# Patient Record
Sex: Female | Born: 1990 | Race: Black or African American | Hispanic: No | Smoking: Never smoker
Health system: Southern US, Community
[De-identification: ages and names within clinical notes are randomized; demographics above are authoritative.]

## PROBLEM LIST (undated history)

## (undated) ENCOUNTER — Inpatient Hospital Stay (HOSPITAL_COMMUNITY): Payer: Self-pay

## (undated) DIAGNOSIS — E282 Polycystic ovarian syndrome: Secondary | ICD-10-CM

## (undated) DIAGNOSIS — Z3490 Encounter for supervision of normal pregnancy, unspecified, unspecified trimester: Secondary | ICD-10-CM

## (undated) HISTORY — PX: NO PAST SURGERIES: SHX2092

---

## 2011-10-30 ENCOUNTER — Ambulatory Visit (INDEPENDENT_AMBULATORY_CARE_PROVIDER_SITE_OTHER): Payer: 59 | Admitting: Internal Medicine

## 2011-10-30 VITALS — BP 100/67 | HR 62 | Temp 98.0°F | Resp 16 | Ht 67.5 in | Wt 165.0 lb

## 2011-10-30 DIAGNOSIS — M542 Cervicalgia: Secondary | ICD-10-CM

## 2011-10-30 DIAGNOSIS — R591 Generalized enlarged lymph nodes: Secondary | ICD-10-CM

## 2011-10-30 DIAGNOSIS — R599 Enlarged lymph nodes, unspecified: Secondary | ICD-10-CM

## 2011-10-30 DIAGNOSIS — J029 Acute pharyngitis, unspecified: Secondary | ICD-10-CM

## 2011-10-30 MED ORDER — AMOXICILLIN 500 MG PO CAPS: 1000.0000 mg | ORAL_CAPSULE | Freq: Three times a day (TID) | ORAL | Status: DC

## 2011-10-30 NOTE — Progress Notes (Signed)
  Subjective:    Patient ID: Allison Barnett, female    DOB: 1990-09-08, 21 y.o.   MRN: [id number]  HPI 21 yo AA F presents to office w/ 2 day hx of swollen glands and throat pain on the left side of her face and neck. Pt. states she feels a pressure like pain under her left ear, under the left side of her jaw and at her neck.  This is worse when she eats certain foods that have carbohydrates in them or are sour flavored.  She specifically mentions chicken alfredo.  Additionally, she feels an itching discomfort along the left side of her throat when she swallows food.  She denies fevers and sinus congestion but says she has exposure to sick contacts at [employer], where she works.  In the past she had something similar and was told she had a blocked salivary gland which responded to ABX tx.    Review of Systems Noncontributory Positives:  -HEENT: Headaches (occasional, treated with ibprofen)      Objective:   Physical Exam General:  Pleasant 21 yo F oriented and cooperative.   Vitals:  Filed Vitals:   10/30/11 1042  BP: 100/67  Pulse: 62  Temp: 98 F (36.7 C)  Resp: 16  HEENT:  -H: nontraumatic,  -E: EOMIT, sclera clear,  -E: L ear tender to palpation and otoschopic exam, L ear canal small/tender redness at the lower meatus,TM's without abnormalities bilaterally, canals clear -N: Nasal turbinates moist and pink,  T: pharynx non-erythematous and without lesions, L neck/,sublingual and submandibular glands tender to palpation but without heat or erythema.Not enlarged.  LN palpable and tender under the angle of the L mandible, trachea midline. Upper molar on the left tender at the root in buccal area Heart: RRR, no murmurs Lungs: CTA bilaterally, no wheezing, rales, rhonci MSK: normal bulk and tone Neuro: alert, oriented, CN grossly in tact      Assessment & Plan:  Assessment: 1) L auricle abscess ? 2) Lymphadenopathy due to ? #1 vs molar abscess vs Sialadenitis 3) ?early viral  pharyngitis    Plan: 1-3) Amoxicillin 500 mg 2 tablets bid x10 days 2-3) Eat sour drops  1-3) RTC if not cleared up in 2-3 days or as needed. ibuprofen

## 2014-09-13 ENCOUNTER — Emergency Department (HOSPITAL_COMMUNITY)
Admission: EM | Admit: 2014-09-13 | Discharge: 2014-09-13 | Disposition: A | Discharge status: Home / Self Care | Payer: BLUE CROSS/BLUE SHIELD | Attending: Emergency Medicine | Admitting: Emergency Medicine

## 2014-09-13 ENCOUNTER — Encounter (HOSPITAL_COMMUNITY): Payer: Self-pay

## 2014-09-13 ENCOUNTER — Emergency Department (HOSPITAL_COMMUNITY): Payer: BLUE CROSS/BLUE SHIELD

## 2014-09-13 DIAGNOSIS — Y998 Other external cause status: Secondary | ICD-10-CM | POA: Diagnosis not present

## 2014-09-13 DIAGNOSIS — S4992XA Unspecified injury of left shoulder and upper arm, initial encounter: Secondary | ICD-10-CM | POA: Diagnosis present

## 2014-09-13 DIAGNOSIS — S43102A Unspecified dislocation of left acromioclavicular joint, initial encounter: Secondary | ICD-10-CM | POA: Insufficient documentation

## 2014-09-13 DIAGNOSIS — Y9389 Activity, other specified: Secondary | ICD-10-CM | POA: Insufficient documentation

## 2014-09-13 DIAGNOSIS — Y92410 Unspecified street and highway as the place of occurrence of the external cause: Secondary | ICD-10-CM | POA: Diagnosis not present

## 2014-09-13 MED ORDER — MORPHINE SULFATE (PF) 2 MG/ML IV SOLN
2.0000 mg | Freq: Once | INTRAVENOUS | Status: AC
  Administered 2014-09-13: 2 mg via INTRAVENOUS
  Filled 2014-09-13: qty 1

## 2014-09-13 MED ORDER — MORPHINE SULFATE 2 MG/ML IJ SOLN
INTRAMUSCULAR | Status: DC | PRN
  Administered 2014-09-13: 4 mg via INTRAVENOUS

## 2014-09-13 MED ORDER — MORPHINE SULFATE (PF) 2 MG/ML IV SOLN
INTRAVENOUS | Status: AC
  Filled 2014-09-13: qty 2

## 2014-09-13 MED ORDER — OXYCODONE-ACETAMINOPHEN 5-325 MG PO TABS: 1.0000 | ORAL_TABLET | Freq: Four times a day (QID) | ORAL | Status: DC | PRN

## 2014-09-13 MED ORDER — OXYCODONE-ACETAMINOPHEN 5-325 MG PO TABS
1.0000 | ORAL_TABLET | Freq: Once | ORAL | Status: AC
  Administered 2014-09-13: 1 via ORAL
  Filled 2014-09-13: qty 1

## 2014-09-13 MED ORDER — SODIUM CHLORIDE 0.9 % IV SOLN
INTRAVENOUS | Status: DC | PRN
  Administered 2014-09-13: 10 mL/h via INTRAVENOUS

## 2014-09-13 NOTE — ED Notes (Signed)
Pt stable, ambulatory, states understanding of discharge instructions 
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
## 2014-09-13 NOTE — ED Notes (Signed)
X-ray at bedside

## 2014-09-13 NOTE — Discharge Instructions (Signed)
Acromioclavicular Injuries °The acromioclavicular (AC) joint is the joint in the shoulder. There are many bands of tissue (ligaments) that surround the AC bones and joints. These bands of tissue can tear, which can lead to sprains and separations. The bones of the AC joint can also break (fracture).  °HOME CARE  °· Put ice on the injured area. °¨ Put ice in a plastic bag. °¨ Place a towel between your skin and the bag. °¨ Leave the ice on for 15-20 minutes, 03-04 times a day. °· Wear your sling as told by your doctor. Remove the sling before showering and bathing. Keep the shoulder in the same place as when the sling is on. Do not lift the arm. °· Gently tighten your figure-eight splint (if applied) every day. Tighten it enough to keep the shoulders held back. There should be room to place your finger between your body and the strap. Loosen the splint right away if you lose feeling (numbness) or have tingling in your hands. °· Only take medicine as told by your doctor. °· Keep all follow-up visits with your doctor. °GET HELP RIGHT AWAY IF:  °· Your medicine does not help your pain. °· You have more puffiness (swelling) or your bruising gets worse rather than better. °· You were unable to follow up as told by your doctor. °· You have tingling or lose even more feeling in your arm, forearm, or hand. °· Your arm is cold or pale. °· You have more pain in the hand, forearm, or fingers. °MAKE SURE YOU:  °· Understand these instructions. °· Will watch your condition. °· Will get help right away if you are not doing well or get worse. °Document Released: 07/01/2009 Document Revised: 04/05/2011 Document Reviewed: 07/01/2009 °Exit[employer]® Patient Information ©2015 [employer]. This information is not intended to replace advice given to you by your health care provider. Make sure you discuss any questions you have with your health care provider. ° °

## 2014-09-13 NOTE — ED Notes (Signed)
See trauma narrator 

## 2016-01-08 ENCOUNTER — Emergency Department (HOSPITAL_COMMUNITY)
Admission: EM | Admit: 2016-01-08 | Discharge: 2016-01-08 | Disposition: A | Discharge status: Home / Self Care | Payer: Medicaid Other | Attending: Emergency Medicine | Admitting: Emergency Medicine

## 2016-01-08 ENCOUNTER — Encounter (HOSPITAL_COMMUNITY): Payer: Self-pay | Admitting: Emergency Medicine

## 2016-01-08 DIAGNOSIS — N390 Urinary tract infection, site not specified: Secondary | ICD-10-CM

## 2016-01-08 DIAGNOSIS — O2341 Unspecified infection of urinary tract in pregnancy, first trimester: Secondary | ICD-10-CM | POA: Insufficient documentation

## 2016-01-08 DIAGNOSIS — O99281 Endocrine, nutritional and metabolic diseases complicating pregnancy, first trimester: Secondary | ICD-10-CM | POA: Diagnosis not present

## 2016-01-08 DIAGNOSIS — O219 Vomiting of pregnancy, unspecified: Secondary | ICD-10-CM | POA: Diagnosis present

## 2016-01-08 DIAGNOSIS — Z3A01 Less than 8 weeks gestation of pregnancy: Secondary | ICD-10-CM | POA: Diagnosis not present

## 2016-01-08 DIAGNOSIS — O2311 Infections of bladder in pregnancy, first trimester: Secondary | ICD-10-CM | POA: Insufficient documentation

## 2016-01-08 DIAGNOSIS — E860 Dehydration: Secondary | ICD-10-CM

## 2016-01-08 LAB — I-STAT BETA HCG BLOOD, ED (MC, WL, AP ONLY)

## 2016-01-08 LAB — CBC WITH DIFFERENTIAL/PLATELET
BASOS ABS: 0 10*3/uL (ref 0.0–0.1)
Basophils Relative: 0 %
EOS PCT: 0 %
Eosinophils Absolute: 0 10*3/uL (ref 0.0–0.7)
HCT: 37.7 % (ref 36.0–46.0)
HEMOGLOBIN: 13.3 g/dL (ref 12.0–15.0)
LYMPHS PCT: 25 %
Lymphs Abs: 0.8 10*3/uL (ref 0.7–4.0)
MCH: 31.5 pg (ref 26.0–34.0)
MCHC: 35.3 g/dL (ref 30.0–36.0)
MCV: 89.3 fL (ref 78.0–100.0)
Monocytes Absolute: 0.2 10*3/uL (ref 0.1–1.0)
Monocytes Relative: 6 %
NEUTROS PCT: 69 %
Neutro Abs: 2.2 10*3/uL (ref 1.7–7.7)
PLATELETS: 214 10*3/uL (ref 150–400)
RBC: 4.22 MIL/uL (ref 3.87–5.11)
RDW: 11.9 % (ref 11.5–15.5)
WBC: 3.2 10*3/uL — AB (ref 4.0–10.5)

## 2016-01-08 LAB — COMPREHENSIVE METABOLIC PANEL
ALK PHOS: 44 U/L (ref 38–126)
ALT: 16 U/L (ref 14–54)
ANION GAP: 11 (ref 5–15)
AST: 19 U/L (ref 15–41)
Albumin: 4.4 g/dL (ref 3.5–5.0)
BILIRUBIN TOTAL: 1.1 mg/dL (ref 0.3–1.2)
BUN: 7 mg/dL (ref 6–20)
CALCIUM: 9.6 mg/dL (ref 8.9–10.3)
CO2: 21 mmol/L — ABNORMAL LOW (ref 22–32)
Chloride: 104 mmol/L (ref 101–111)
Creatinine, Ser: 0.77 mg/dL (ref 0.44–1.00)
GFR calc non Af Amer: >60 mL/min (ref >60)
Glucose, Bld: 102 mg/dL — ABNORMAL HIGH (ref 65–99)
Potassium: 3.4 mmol/L — ABNORMAL LOW (ref 3.5–5.1)
SODIUM: 136 mmol/L (ref 135–145)
TOTAL PROTEIN: 8.2 g/dL — AB (ref 6.5–8.1)

## 2016-01-08 LAB — URINALYSIS, ROUTINE W REFLEX MICROSCOPIC
Bilirubin Urine: NEGATIVE
GLUCOSE, UA: NEGATIVE mg/dL
HGB URINE DIPSTICK: NEGATIVE
Ketones, ur: 80 mg/dL — AB
NITRITE: POSITIVE — AB
PH: 5 (ref 5.0–8.0)
Protein, ur: 100 mg/dL — AB
SPECIFIC GRAVITY, URINE: 1.029 (ref 1.005–1.030)

## 2016-01-08 LAB — LIPASE, BLOOD: LIPASE: 21 U/L (ref 11–51)

## 2016-01-08 MED ORDER — PYRIDOXINE HCL 100 MG PO TABS: 100.0000 mg | ORAL_TABLET | Freq: Every day | ORAL | 0 refills | Status: DC

## 2016-01-08 MED ORDER — SODIUM CHLORIDE 0.9 % IV BOLUS (SEPSIS)
1000.0000 mL | Freq: Once | INTRAVENOUS | Status: AC
  Administered 2016-01-08: 1000 mL via INTRAVENOUS

## 2016-01-08 MED ORDER — DOXYLAMINE SUCCINATE (SLEEP) 25 MG PO TABS: 25.0000 mg | ORAL_TABLET | Freq: Every evening | ORAL | 0 refills | Status: DC | PRN

## 2016-01-08 MED ORDER — CEPHALEXIN 500 MG PO CAPS: 500.0000 mg | ORAL_CAPSULE | Freq: Three times a day (TID) | ORAL | 0 refills | Status: DC

## 2016-01-08 MED ORDER — PYRIDOXINE HCL 100 MG/ML IJ SOLN
100.0000 mg | Freq: Every day | INTRAMUSCULAR | Status: DC
  Administered 2016-01-08: 100 mg via INTRAVENOUS
  Filled 2016-01-08: qty 1

## 2016-01-08 MED ORDER — DEXTROSE 5 % IV SOLN
1.0000 g | Freq: Once | INTRAVENOUS | Status: AC
  Administered 2016-01-08: 1 g via INTRAVENOUS
  Filled 2016-01-08: qty 10

## 2016-01-08 MED ORDER — PROMETHAZINE HCL 25 MG/ML IJ SOLN
12.5000 mg | Freq: Once | INTRAMUSCULAR | Status: AC
  Administered 2016-01-08: 12.5 mg via INTRAVENOUS
  Filled 2016-01-08: qty 1

## 2016-01-08 NOTE — ED Provider Notes (Signed)
[facility]-EMERGENCY DEPT Provider Note   CSN: [id number] Arrival date & time: 01/08/16  1117     History   Chief Complaint Chief Complaint  Patient presents with  . Emesis    HPI Allison Barnett is a 25 y.o. female.  HPI   25 year old female, G2 A1 who recently found out yesterday that she is pregnant is presenting today complaining of nausea and vomiting. Patient states she believes her last menstrual period was sometime in October. She has a positive pregnancy test 5 weeks ago. She has been doing fine up until yesterday when she developed nausea and vomiting. States that she has been vomiting once hourly of clear fluid but this morning she noticed small trace of blood in her vomitus. Complaining of only mild abdominal discomfort while vomiting but no significant abdominal pain. She noticed that her urine is darker in color with a stronger smell but denies any burning on urination or urinary frequency or urgency. No associated fever, lightheadedness, dizziness, chest pain, shortness of breath, back pain, hematuria, vaginal bleeding, or vaginal discharge. She does report some common pregnancy symptoms such as breasts sensitivity. She was seen by an OB/GYN yesterday and states that she did had a formal ultrasound confirming IUP.  History reviewed. No pertinent past medical history.  There are no active problems to display for this patient.   History reviewed. No pertinent surgical history.  OB History    Gravida Para Term Preterm AB Living   1 0 0 0 0     SAB TAB Ectopic Multiple Live Births   0 0 0           Home Medications    Prior to Admission medications   Medication Sig Start Date End Date Taking? Authorizing Provider  amoxicillin (AMOXIL) 500 MG capsule Take 2 capsules (1,000 mg total) by mouth 3 (three) times daily. 10/30/11   Tonye Pearsonobert P Doolittle, MD  ibuprofen (ADVIL,MOTRIN) 200 MG tablet Take 200 mg by mouth every 6 (six) hours as needed for headache.    Historical  Provider, MD  oxyCODONE-acetaminophen (PERCOCET/ROXICET) 5-325 MG per tablet Take 1 tablet by mouth every 6 (six) hours as needed for severe pain. 09/13/14   Ames DuraStephen Balleh, MD    Family History History reviewed. No pertinent family history.  Social History Social History  Substance Use Topics  . Smoking status: Never Smoker  . Smokeless tobacco: Not on file  . Alcohol use No     Allergies   Patient has no known allergies.   Review of Systems Review of Systems  All other systems reviewed and are negative.    Physical Exam Updated Vital Signs BP 115/81   Pulse 88   Temp 98.5 F (36.9 C) (Oral)   Resp 16   LMP 11/09/2015 (Approximate)   SpO2 100%   Physical Exam  Constitutional: She appears well-developed and well-nourished. No distress.  HENT:  Head: Atraumatic.  Mouth/Throat: Oropharynx is clear and moist.  Eyes: Conjunctivae are normal.  Neck: Neck supple.  Cardiovascular: Normal rate and regular rhythm.   Pulmonary/Chest: Effort normal and breath sounds normal.  Abdominal: Soft. She exhibits no distension. There is tenderness (Mild epigastric tenderness on palpation without guarding or rebound tenderness. Negative Murphy sign, no pain at McBurney's point.).  Genitourinary:  Genitourinary Comments: No CVA tenderness  Neurological: She is alert.  Skin: No rash noted.  Psychiatric: She has a normal mood and affect.  Nursing note and vitals reviewed.    ED Treatments / Results  Labs (all labs ordered are listed, but only abnormal results are displayed) Labs Reviewed  CBC WITH DIFFERENTIAL/PLATELET - Abnormal; Notable for the following:       Result Value   WBC 3.2 (*)    All other components within normal limits  COMPREHENSIVE METABOLIC PANEL - Abnormal; Notable for the following:    Potassium 3.4 (*)    CO2 21 (*)    Glucose, Bld 102 (*)    Total Protein 8.2 (*)    All other components within normal limits  URINALYSIS, ROUTINE W REFLEX MICROSCOPIC -  Abnormal; Notable for the following:    Color, Urine AMBER (*)    APPearance CLOUDY (*)    Ketones, ur 80 (*)    Protein, ur 100 (*)    Nitrite POSITIVE (*)    Leukocytes, UA LARGE (*)    Bacteria, UA MANY (*)    Squamous Epithelial / LPF TOO NUMEROUS TO COUNT (*)    All other components within normal limits  I-STAT BETA HCG BLOOD, ED ([facility], [facility], [facility] ONLY) - Abnormal; Notable for the following:    I-stat hCG, quantitative >2,000.0 (*)    All other components within normal limits  URINE CULTURE  LIPASE, BLOOD    EKG  EKG Interpretation None       Radiology No results found.  Procedures Procedures (including critical care time)  Medications Ordered in ED Medications  pyridOXINE (B-6) injection 100 mg (100 mg Intravenous Given 01/08/16 1328)  sodium chloride 0.9 % bolus 1,000 mL (0 mLs Intravenous Stopped 01/08/16 1300)  promethazine (PHENERGAN) injection 12.5 mg (12.5 mg Intravenous Given 01/08/16 1239)  cefTRIAXone (ROCEPHIN) 1 g in dextrose 5 % 50 mL IVPB (0 g Intravenous Stopped 01/08/16 1420)  sodium chloride 0.9 % bolus 1,000 mL (0 mLs Intravenous Stopped 01/08/16 1510)     Initial Impression / Assessment and Plan / ED Course  I have reviewed the triage vital signs and the nursing notes.  Pertinent labs & imaging results that were available during my care of the patient were reviewed by me and considered in my medical decision making (see chart for details).  Clinical Course     BP 90/57   Pulse 67   Temp 98.5 F (36.9 C) (Oral)   Resp 20   LMP 11/09/2015 (Approximate)   SpO2 99%    Final Clinical Impressions(s) / ED Diagnoses   Final diagnoses:  Acute cystitis during pregnancy, first trimester  Lower urinary tract infectious disease  Dehydration    New Prescriptions New Prescriptions   CEPHALEXIN (KEFLEX) 500 MG CAPSULE    Take 1 capsule (500 mg total) by mouth 3 (three) times daily.   DOXYLAMINE, SLEEP, (UNISOM) 25 MG TABLET    Take 1 tablet (25 mg  total) by mouth at bedtime as needed (nausea).   PYRIDOXINE (B-6) 100 MG TABLET    Take 1 tablet (100 mg total) by mouth daily.   11:49 AM Pregnant female presents with nausea and vomiting but no significant abdominal discomfort. Suspect that this is pregnancy-induced. She has had an ultrasound confirming IUP according to patient. She does notice some dark urine, therefore I would check a urine to ensure no finding to suggest an underlying UTI causing her symptom. IV fluid, and antinausea medication given.  3:55 PM Urine shows signs of urinary tract infection. Labs are otherwise reassuring with exceptions of signs of dehydration as evidenced by increased level of ketone in her urine. Patient received IV fluid, antinausea medication, and  antibiotic. She felt much better and able to tolerates by mouth. She'll be discharged with antibiotic and antinausea medication. [facility]. She does not have signs of polynephritis. She has had a formal US confirming IUP according to the pt.     Fayrene HelperBowie Shyah Cadmus, PA-C 01/08/16 1558    Shaune Pollackameron Isaacs, MD 01/08/16 [phone]

## 2016-01-08 NOTE — Discharge Instructions (Signed)
You have been diagnosed with a urinary tract infection.  Take Keflex as prescribed for the full duration.  Take Unisom and pyridoxine together as needed for nausea.  Follow up with OBGYN for further management of your health

## 2016-01-08 NOTE — ED Notes (Signed)
Patient is resting comfortably. 

## 2016-01-08 NOTE — ED Notes (Signed)
Reports nausea feels better; attempting to drink sprite

## 2016-01-08 NOTE — ED Triage Notes (Addendum)
Pt here for N/V starting yesterday; pt sts found out yesterday she was pregnant; LMP was in October; G2 A1

## 2016-01-08 NOTE — ED Notes (Signed)
Pt stable, understands discharge instructions, and reasons for return.   

## 2016-01-10 LAB — URINE CULTURE
Culture: >=100000 — AB
SPECIAL REQUESTS: NORMAL

## 2016-01-11 ENCOUNTER — Telehealth: Payer: Self-pay | Admitting: Obstetrics and Gynecology

## 2016-01-11 ENCOUNTER — Telehealth: Payer: Self-pay

## 2016-01-11 MED ORDER — SULFAMETHOXAZOLE-TRIMETHOPRIM 400-80 MG PO TABS: 1.0000 | ORAL_TABLET | Freq: Two times a day (BID) | ORAL | 0 refills | Status: DC

## 2016-01-11 MED ORDER — AMOXICILLIN-POT CLAVULANATE 875-125 MG PO TABS: 1.0000 | ORAL_TABLET | Freq: Two times a day (BID) | ORAL | 0 refills | Status: AC

## 2016-01-11 NOTE — Telephone Encounter (Signed)
Rx CALLED IN  For Augmentin x 7d  For uti with ESBL E COLI.

## 2016-01-11 NOTE — Telephone Encounter (Signed)
Pregnant Pt with +UC. Pharmacy contacted on call OB and they will contact pt. Per [place] HunAllison Masters Pharm D

## 2016-01-18 ENCOUNTER — Emergency Department (HOSPITAL_COMMUNITY)
Admission: EM | Admit: 2016-01-18 | Discharge: 2016-01-18 | Disposition: A | Discharge status: Home / Self Care | Payer: Medicaid Other | Attending: Emergency Medicine | Admitting: Emergency Medicine

## 2016-01-18 ENCOUNTER — Encounter (HOSPITAL_COMMUNITY): Payer: Self-pay

## 2016-01-18 ENCOUNTER — Emergency Department (HOSPITAL_COMMUNITY): Payer: Medicaid Other

## 2016-01-18 DIAGNOSIS — R102 Pelvic and perineal pain: Secondary | ICD-10-CM

## 2016-01-18 DIAGNOSIS — O209 Hemorrhage in early pregnancy, unspecified: Secondary | ICD-10-CM | POA: Diagnosis present

## 2016-01-18 DIAGNOSIS — O26899 Other specified pregnancy related conditions, unspecified trimester: Secondary | ICD-10-CM

## 2016-01-18 DIAGNOSIS — Z3A08 8 weeks gestation of pregnancy: Secondary | ICD-10-CM | POA: Insufficient documentation

## 2016-01-18 DIAGNOSIS — O4690 Antepartum hemorrhage, unspecified, unspecified trimester: Secondary | ICD-10-CM

## 2016-01-18 DIAGNOSIS — N9489 Other specified conditions associated with female genital organs and menstrual cycle: Secondary | ICD-10-CM | POA: Insufficient documentation

## 2016-01-18 HISTORY — DX: Encounter for supervision of normal pregnancy, unspecified, unspecified trimester: Z34.90

## 2016-01-18 LAB — WET PREP, GENITAL
CLUE CELLS WET PREP: NONE SEEN
TRICH WET PREP: NONE SEEN
YEAST WET PREP: NONE SEEN

## 2016-01-18 LAB — HCG, QUANTITATIVE, PREGNANCY: hCG, Beta Chain, Quant, S: 162947 m[IU]/mL — ABNORMAL HIGH (ref <5)

## 2016-01-18 NOTE — ED Notes (Signed)
Placed patient into a gown and on the monitor 

## 2016-01-18 NOTE — ED Notes (Signed)
Pt returns from us

## 2016-01-18 NOTE — ED Provider Notes (Signed)
[facility] DEPT Provider Note   CSN: [id number] Arrival date & time: 01/18/16  1226     History   Chief Complaint No chief complaint on file.   HPI Allison Barnett is a 25 y.o. female.  25 year old female who is age [redacted] weeks pregnant states this morning after intercourse.  She noticed that there was bleeding on the sheets.  Denies any dysuria or vaginal discharge  States the bleeding has stopped, but she is having a nagging left lower quadrant pain.  She has her first OB/GYN appointment January 5 and OB ultrasound on January 9.  She reports that she's had very irregular periods most of her adult life.  Her pregnancy was confirmed at the pregnancy.  Center approximately 3 weeks ago      Past Medical History:  Diagnosis Date  . Pregnant     There are no active problems to display for this patient.   History reviewed. No pertinent surgical history.  OB History    Gravida Para Term Preterm AB Living   1 0 0 0 0     SAB TAB Ectopic Multiple Live Births   0 0 0           Home Medications    Prior to Admission medications   Medication Sig Start Date End Date Taking? Authorizing Provider  amoxicillin-clavulanate (AUGMENTIN) 875-125 MG tablet Take 1 tablet by mouth 2 (two) times daily. 01/11/16 01/18/16 Yes Tilda BurrowJohn V Ferguson, MD  doxylamine, Sleep, (UNISOM) 25 MG tablet Take 1 tablet (25 mg total) by mouth at bedtime as needed (nausea). 01/08/16  Yes Fayrene HelperBowie Tran, PA-C  Prenatal Vit-Fe Fumarate-FA (PRENATAL MULTIVITAMIN) TABS tablet Take 1 tablet by mouth daily at 12 noon.   Yes Historical Provider, MD  pyridoxine (B-6) 100 MG tablet Take 1 tablet (100 mg total) by mouth daily. 01/08/16  Yes Fayrene HelperBowie Tran, PA-C  amoxicillin (AMOXIL) 500 MG capsule Take 2 capsules (1,000 mg total) by mouth 3 (three) times daily. Patient not taking: Reported on 01/18/2016 10/30/11   Tonye Pearsonobert P Doolittle, MD  ibuprofen (ADVIL,MOTRIN) 200 MG tablet Take 200 mg by mouth every 6 (six) hours as needed  for headache.    Historical Provider, MD  oxyCODONE-acetaminophen (PERCOCET/ROXICET) 5-325 MG per tablet Take 1 tablet by mouth every 6 (six) hours as needed for severe pain. Patient not taking: Reported on 01/18/2016 09/13/14   Ames DuraStephen Balleh, MD    Family History No family history on file.  Social History Social History  Substance Use Topics  . Smoking status: Never Smoker  . Smokeless tobacco: Not on file  . Alcohol use No     Allergies   Patient has no known allergies.   Review of Systems Review of Systems  Constitutional: Negative for fever.  Gastrointestinal: Positive for abdominal pain.  Genitourinary: Positive for vaginal bleeding. Negative for dysuria, vaginal discharge and vaginal pain.  Neurological: Negative for dizziness, light-headedness and headaches.  All other systems reviewed and are negative.    Physical Exam Updated Vital Signs BP 113/72 (BP Location: Right Arm)   Pulse 70   Temp 99.2 F (37.3 C)   Resp 16   LMP 11/09/2015 (Approximate)   SpO2 98%   Physical Exam  Constitutional: She appears well-developed and well-nourished.  Eyes: Pupils are equal, round, and reactive to light.  Neck: Normal range of motion.  Cardiovascular: Normal rate.   Pulmonary/Chest: Effort normal.  Abdominal: Soft. She exhibits no distension. There is tenderness in the left lower quadrant.  Genitourinary:  Vagina normal. Right adnexum displays no mass and no tenderness. Left adnexum displays no mass and no tenderness. No bleeding in the vagina. No vaginal discharge found.  Neurological: She is alert.  Skin: Skin is warm. Capillary refill takes less than 2 seconds.  Nursing note and vitals reviewed.    ED Treatments / Results  Labs (all labs ordered are listed, but only abnormal results are displayed) Labs Reviewed  WET PREP, GENITAL - Abnormal; Notable for the following:       Result Value   WBC, Wet Prep HPF POC MANY (*)    All other components within normal  limits  HCG, QUANTITATIVE, PREGNANCY - Abnormal; Notable for the following:    hCG, Beta Chain, Quant, S 162,947 (*)    All other components within normal limits  GC/CHLAMYDIA PROBE AMP ([place]) NOT AT [facility]    EKG  EKG Interpretation None       Radiology [place] Ob Comp Less 14 Wks  Result Date: 01/18/2016 CLINICAL DATA:  Vaginal bleeding.  Beta HCG of 163,000. EXAM: OBSTETRIC <14 WK US AND TRANSVAGINAL OB US TECHNIQUE: Both transabdominal and transvaginal ultrasound examinations were performed for complete evaluation of the gestation as well as the maternal uterus, adnexal regions, and pelvic cul-de-sac. Transvaginal technique was performed to assess early pregnancy. COMPARISON:  None. FINDINGS: Intrauterine gestational sac: Present Yolk sac:  Visualized Embryo:  Visualized Cardiac Activity: Present Heart Rate: 150  bpm CRL:  15.7  mm   8 w   0 d                  US EDC: 08/29/2016 Subchorionic hemorrhage:  None visualized. Maternal uterus/adnexae: Right ovarian corpus luteal cyst measures 2.3 x 1.9 x 2.2 cm. Normal left adnexa. No significant free fluid. IMPRESSION: 1. Intrauterine pregnancy of approximately 8 weeks 0 days with fetal heart rate 150 beats per minute. 2. Right ovarian corpus luteal cyst. Electronically Signed   By: Jeronimo GreavesKyle  Talbot M.D.   On: 01/18/2016 16:39   [place] Ob Transvaginal  Result Date: 01/18/2016 CLINICAL DATA:  Vaginal bleeding.  Beta HCG of 163,000. EXAM: OBSTETRIC <14 WK US AND TRANSVAGINAL OB US TECHNIQUE: Both transabdominal and transvaginal ultrasound examinations were performed for complete evaluation of the gestation as well as the maternal uterus, adnexal regions, and pelvic cul-de-sac. Transvaginal technique was performed to assess early pregnancy. COMPARISON:  None. FINDINGS: Intrauterine gestational sac: Present Yolk sac:  Visualized Embryo:  Visualized Cardiac Activity: Present Heart Rate: 150  bpm CRL:  15.7  mm   8 w   0 d                  US EDC: 08/29/2016  Subchorionic hemorrhage:  None visualized. Maternal uterus/adnexae: Right ovarian corpus luteal cyst measures 2.3 x 1.9 x 2.2 cm. Normal left adnexa. No significant free fluid. IMPRESSION: 1. Intrauterine pregnancy of approximately 8 weeks 0 days with fetal heart rate 150 beats per minute. 2. Right ovarian corpus luteal cyst. Electronically Signed   By: Jeronimo GreavesKyle  Talbot M.D.   On: 01/18/2016 16:39    Procedures Procedures (including critical care time)  Medications Ordered in ED Medications - No data to display   Initial Impression / Assessment and Plan / ED Course  I have reviewed the triage vital signs and the nursing notes.  Pertinent labs & imaging results that were available during my care of the patient were reviewed by me and considered in my medical decision making (see chart for  details).  Clinical Course    Patient has confirmed IUP 8 weeks Wet prep is normal.  No indication for an STD at this point.  She does have follow-up January 5 with her first OB appointment, Recommend pelvic rest until seen at that time   Final Clinical Impressions(s) / ED Diagnoses   Final diagnoses:  Vaginal bleeding in pregnancy, first trimester    New Prescriptions New Prescriptions   No medications on file     Earley Favor, NP 01/18/16 1709    Earley Favor, NP 01/18/16 1712    Lavera Guise, MD 01/20/16 [phone]

## 2016-01-18 NOTE — ED Notes (Signed)
Pt and husband state they understand instructions. All questions and concerns addressed. Home stable with steady gait.

## 2016-01-18 NOTE — Discharge Instructions (Signed)
Ultrasound shows that you have a single intrauterine pregnancy at 8 weeks with a due date of August 5. I recommend that you practice pelvic rest, which is nothing in the vagina, which includes abstaining from intercourse , tampons baths , swimming , douching , until you're seen January 5.  byyour OB/GYN

## 2016-01-18 NOTE — ED Notes (Signed)
Patient transported to Ultrasound 

## 2016-01-18 NOTE — ED Triage Notes (Addendum)
Patient complains of light vaginal bleeding since am. States that she is [redacted] weeks pregnant. No abdominal pain, no cramping. G1, P0

## 2016-01-20 LAB — GC/CHLAMYDIA PROBE AMP (~~LOC~~) NOT AT ARMC
Chlamydia: POSITIVE — AB
Neisseria Gonorrhea: NEGATIVE

## 2016-01-23 NOTE — ED Notes (Addendum)
Called patient with lab results , will call medication into [facility] [address] Cefixine 400mg  x1 and Zithromax 1gpo x1. This is 01/23/2016

## 2016-07-04 ENCOUNTER — Emergency Department (HOSPITAL_COMMUNITY)
Admission: EM | Admit: 2016-07-04 | Discharge: 2016-07-04 | Disposition: A | Discharge status: Home / Self Care | Payer: Medicaid Other | Attending: Emergency Medicine | Admitting: Emergency Medicine

## 2016-07-04 ENCOUNTER — Encounter (HOSPITAL_COMMUNITY): Payer: Self-pay

## 2016-07-04 DIAGNOSIS — E860 Dehydration: Secondary | ICD-10-CM | POA: Diagnosis not present

## 2016-07-04 DIAGNOSIS — R8271 Bacteriuria: Secondary | ICD-10-CM | POA: Diagnosis not present

## 2016-07-04 DIAGNOSIS — Z3A32 32 weeks gestation of pregnancy: Secondary | ICD-10-CM | POA: Diagnosis not present

## 2016-07-04 DIAGNOSIS — O26893 Other specified pregnancy related conditions, third trimester: Secondary | ICD-10-CM | POA: Insufficient documentation

## 2016-07-04 DIAGNOSIS — R55 Syncope and collapse: Secondary | ICD-10-CM

## 2016-07-04 LAB — CBC WITH DIFFERENTIAL/PLATELET
BASOS ABS: 0 10*3/uL (ref 0.0–0.1)
Basophils Relative: 0 %
EOS ABS: 0 10*3/uL (ref 0.0–0.7)
EOS PCT: 1 %
HCT: 30.7 % — ABNORMAL LOW (ref 36.0–46.0)
Hemoglobin: 10.5 g/dL — ABNORMAL LOW (ref 12.0–15.0)
Lymphocytes Relative: 25 %
Lymphs Abs: 1.4 10*3/uL (ref 0.7–4.0)
MCH: 31.7 pg (ref 26.0–34.0)
MCHC: 34.2 g/dL (ref 30.0–36.0)
MCV: 92.7 fL (ref 78.0–100.0)
Monocytes Absolute: 0.4 10*3/uL (ref 0.1–1.0)
Monocytes Relative: 8 %
Neutro Abs: 3.6 10*3/uL (ref 1.7–7.7)
Neutrophils Relative %: 66 %
PLATELETS: 294 10*3/uL (ref 150–400)
RBC: 3.31 MIL/uL — AB (ref 3.87–5.11)
RDW: 12.3 % (ref 11.5–15.5)
WBC: 5.4 10*3/uL (ref 4.0–10.5)

## 2016-07-04 LAB — COMPREHENSIVE METABOLIC PANEL
ALT: 40 U/L (ref 14–54)
AST: 26 U/L (ref 15–41)
Albumin: 2.5 g/dL — ABNORMAL LOW (ref 3.5–5.0)
Alkaline Phosphatase: 106 U/L (ref 38–126)
Anion gap: 6 (ref 5–15)
BUN: 6 mg/dL (ref 6–20)
CALCIUM: 8.5 mg/dL — AB (ref 8.9–10.3)
CO2: 21 mmol/L — ABNORMAL LOW (ref 22–32)
CREATININE: 0.58 mg/dL (ref 0.44–1.00)
Chloride: 106 mmol/L (ref 101–111)
GFR calc Af Amer: >60 mL/min (ref >60)
Glucose, Bld: 88 mg/dL (ref 65–99)
Potassium: 3.4 mmol/L — ABNORMAL LOW (ref 3.5–5.1)
Sodium: 133 mmol/L — ABNORMAL LOW (ref 135–145)
Total Bilirubin: 0.4 mg/dL (ref 0.3–1.2)
Total Protein: 6.1 g/dL — ABNORMAL LOW (ref 6.5–8.1)

## 2016-07-04 LAB — URINALYSIS, ROUTINE W REFLEX MICROSCOPIC
Bilirubin Urine: NEGATIVE
GLUCOSE, UA: NEGATIVE mg/dL
Hgb urine dipstick: NEGATIVE
KETONES UR: NEGATIVE mg/dL
Nitrite: NEGATIVE
PH: 6 (ref 5.0–8.0)
Protein, ur: NEGATIVE mg/dL
Specific Gravity, Urine: 1.017 (ref 1.005–1.030)

## 2016-07-04 LAB — CBG MONITORING, ED: Glucose-Capillary: 123 mg/dL — ABNORMAL HIGH (ref 65–99)

## 2016-07-04 MED ORDER — CEPHALEXIN 500 MG PO CAPS: 500.0000 mg | ORAL_CAPSULE | Freq: Two times a day (BID) | ORAL | 0 refills | Status: DC

## 2016-07-04 MED ORDER — DEXTROSE 5 % IV SOLN: 1.0000 g | Freq: Once | INTRAVENOUS | Status: DC

## 2016-07-04 MED ORDER — POTASSIUM CHLORIDE CRYS ER 20 MEQ PO TBCR
20.0000 meq | EXTENDED_RELEASE_TABLET | Freq: Once | ORAL | Status: AC
  Administered 2016-07-04: 20 meq via ORAL
  Filled 2016-07-04: qty 1

## 2016-07-04 MED ORDER — DOXYCYCLINE HYCLATE 100 MG PO TABS: 100.0000 mg | ORAL_TABLET | Freq: Once | ORAL | Status: DC

## 2016-07-04 MED ORDER — LACTATED RINGERS IV BOLUS (SEPSIS)
1000.0000 mL | Freq: Once | INTRAVENOUS | Status: AC
  Administered 2016-07-04: 1000 mL via INTRAVENOUS

## 2016-07-04 NOTE — ED Notes (Signed)
Mary with rapid ob at bedside.

## 2016-07-04 NOTE — ED Provider Notes (Signed)
[facility] Provider Note   CSN: [id number] Arrival date & time: 07/04/16  1526     History   Chief Complaint Chief Complaint  Patient presents with  . dizziness/32 weeks preg    HPI Allison Barnett is a 26 y.o. female.  The history is provided by the patient. No language interpreter was used.    Allison Barnett is a 26 y.o. female who presents to the Emergency Department complaining of dizziness.  This [place] is [redacted] weeks pregnant today here for evaluation of dizziness described as near syncopal sensation. She was mildly dizzy intermittently yesterday and today her symptoms have worsened. She has been doing a lot of activity and out in the heat but endorses drinking water regularly. She last ate 2 biscuits this morning for breakfast but has not eaten since then. While she was at work she was trying to sit down and rest at times but continued to feel dizzy and lightheaded. She denies any chest pain, shortness of breath, abdominal pain, cramping, vomiting, diarrhea. Over the last few days she has noted that her baby has been more active and her urine has been a little more dark than usual. No dysuria, vaginal bleeding.  Past Medical History:  Diagnosis Date  . Pregnant     There are no active problems to display for this patient.   History reviewed. No pertinent surgical history.  OB History    Gravida Para Term Preterm AB Living   1 0 0 0 0     SAB TAB Ectopic Multiple Live Births   0 0 0           Home Medications    Prior to Admission medications   Medication Sig Start Date End Date Taking? Authorizing Provider  amoxicillin (AMOXIL) 500 MG capsule Take 2 capsules (1,000 mg total) by mouth 3 (three) times daily. Patient not taking: Reported on 01/18/2016 10/30/11   Tonye Pearson, MD  cephALEXin (KEFLEX) 500 MG capsule Take 1 capsule (500 mg total) by mouth 2 (two) times daily. 07/04/16   Tilden Fossa, MD  doxylamine, Sleep, (UNISOM) 25 MG tablet Take 1  tablet (25 mg total) by mouth at bedtime as needed (nausea). 01/08/16   Fayrene Helper, PA-C  ibuprofen (ADVIL,MOTRIN) 200 MG tablet Take 200 mg by mouth every 6 (six) hours as needed for headache.    [provider]  oxyCODONE-acetaminophen (PERCOCET/ROXICET) 5-325 MG per tablet Take 1 tablet by mouth every 6 (six) hours as needed for severe pain. Patient not taking: Reported on 01/18/2016 09/13/14   Ames Dura, MD  Prenatal Vit-Fe Fumarate-FA (PRENATAL MULTIVITAMIN) TABS tablet Take 1 tablet by mouth daily at 12 noon.    [provider]  pyridoxine (B-6) 100 MG tablet Take 1 tablet (100 mg total) by mouth daily. 01/08/16   Fayrene Helper, PA-C    Family History No family history on file.  Social History Social History  Substance Use Topics  . Smoking status: Never Smoker  . Smokeless tobacco: Not on file  . Alcohol use No     Allergies   Patient has no known allergies.   Review of Systems Review of Systems  All other systems reviewed and are negative.    Physical Exam Updated Vital Signs BP 108/67   Pulse 100   Temp 98.4 F (36.9 C) (Oral)   Resp (!) 22   LMP 11/09/2015 (Approximate)   SpO2 100%   Physical Exam  Constitutional: She is oriented to person, place, and  time. She appears well-developed and well-nourished.  HENT:  Head: Normocephalic and atraumatic.  Cardiovascular: Normal rate and regular rhythm.   No murmur heard. Pulmonary/Chest: Effort normal and breath sounds normal. No respiratory distress.  Abdominal: There is no rebound and no guarding.  Gravid uterus to the upper abdomen. Palpable fetal activity on examination. Nontender abdomen.  Musculoskeletal: She exhibits no edema or tenderness.  Neurological: She is alert and oriented to person, place, and time.  Skin: Skin is warm and dry.  Psychiatric: She has a normal mood and affect. Her behavior is normal.  Nursing note and vitals reviewed.    ED Treatments / Results   Labs (all labs ordered are listed, but only abnormal results are displayed) Labs Reviewed  COMPREHENSIVE METABOLIC PANEL - Abnormal; Notable for the following:       Result Value   Sodium 133 (*)    Potassium 3.4 (*)    CO2 21 (*)    Calcium 8.5 (*)    Total Protein 6.1 (*)    Albumin 2.5 (*)    All other components within normal limits  CBC WITH DIFFERENTIAL/PLATELET - Abnormal; Notable for the following:    RBC 3.31 (*)    Hemoglobin 10.5 (*)    HCT 30.7 (*)    All other components within normal limits  URINALYSIS, ROUTINE W REFLEX MICROSCOPIC - Abnormal; Notable for the following:    APPearance CLOUDY (*)    Leukocytes, UA SMALL (*)    Bacteria, UA RARE (*)    Squamous Epithelial / LPF 6-30 (*)    All other components within normal limits  CBG MONITORING, ED - Abnormal; Notable for the following:    Glucose-Capillary 123 (*)    All other components within normal limits  URINE CULTURE    EKG  EKG Interpretation  Date/Time:  Sunday July 04 2016 17:43:46 EDT Ventricular Rate:  93 PR Interval:    QRS Duration: 79 QT Interval:  351 QTC Calculation: 437 R Axis:   2 Text Interpretation:  Sinus rhythm Low voltage, precordial leads no acute st changes Confirmed by Lincoln Brighamees, Liz [phone] on 07/04/2016 5:48:13 PM       Radiology No results found.  Procedures Procedures (including critical care time)  Medications Ordered in ED Medications  lactated ringers bolus 1,000 mL (0 mLs Intravenous Stopped 07/04/16 1954)  potassium chloride SA (K-DUR,KLOR-CON) CR tablet 20 mEq (20 mEq Oral Given 07/04/16 1950)     Initial Impression / Assessment and Plan / ED Course  I have reviewed the triage vital signs and the nursing notes.  Pertinent labs & imaging results that were available during my care of the patient were reviewed by me and considered in my medical decision making (see chart for details).   patient here for evaluation of lightheadedness and near syncopal sensation. She  is [redacted] weeks pregnant. Presentation is not this with preterm labor. Fetal heart tracing is reassuring. UA does have some bacteria present but also has epithelials. In setting of pregnancy will treat for bacteria. Current clinical picture is consistent with dehydration and over exertion. She is feeling improved on repeat assessment. Plan to DC home with outpatient follow-up. Presentation is not consistent with PE or ACS.  Final Clinical Impressions(s) / ED Diagnoses   Final diagnoses:  Near syncope  Bacteriuria  Dehydration    New Prescriptions Discharge Medication List as of 07/04/2016  7:52 PM    START taking these medications   Details  cephALEXin (KEFLEX) 500 MG capsule  Take 1 capsule (500 mg total) by mouth 2 (two) times daily., Starting Sun 07/04/2016, Print         Tilden Fossa, MD 07/05/16 [phone]

## 2016-07-04 NOTE — Progress Notes (Signed)
Spoke with Dr. Penne LashLeggett. Pt is a G1P0 at [redacted] weeks gestation here with c/o dizziness while at work. Pt did not pass out, no chest pain, nausea, sweating during this episode. No vaginal bleeding or leaking of fluid.[place]. Blood pressure nl. CBG 123. FHR tracing category 1 tracing with ui. Pt is OB cleared.She has an appointment with her OB on Tuesday 07/06/2016.

## 2016-07-04 NOTE — ED Notes (Signed)
OB rapid called to come assess pt.

## 2016-07-04 NOTE — Progress Notes (Signed)
Pt is a G1P0 at [redacted] weeks gestation here with c/o dizziness while at work. Pt did not pass out. Says she has had episodes of dizziness earlier in the pregnancy. No vaginal bleeding or leaking of fluid. Denies abd pain or cramping. [place] in [place], but [facility] in [place]. [facility]. Denies any problems with this pregnancy. Blood pressure 106/75. No chest pain, nausea, or sweating with the episode of dizziness. CBG 123. Says she had two plain buscuits for breakfast this morning and has eaten nothing since.

## 2016-07-04 NOTE — ED Notes (Signed)
ED Provider at bedside. 

## 2016-07-04 NOTE — ED Triage Notes (Signed)
Patient complains of dizziness today while working. No abdominal cramping, no bleeding, no spotting. States that she had these symptoms early pregnancy and today the same. NAD. Alert and oriented. G1, P0

## 2016-07-07 ENCOUNTER — Inpatient Hospital Stay (HOSPITAL_COMMUNITY)
Admission: AD | Admit: 2016-07-07 | Discharge: 2016-07-07 | Disposition: A | Discharge status: Home / Self Care | Payer: Medicaid Other | Source: Ambulatory Visit | Attending: Obstetrics & Gynecology | Admitting: Obstetrics & Gynecology

## 2016-07-07 ENCOUNTER — Encounter (HOSPITAL_COMMUNITY): Payer: Self-pay

## 2016-07-07 DIAGNOSIS — R55 Syncope and collapse: Secondary | ICD-10-CM | POA: Diagnosis not present

## 2016-07-07 DIAGNOSIS — O26893 Other specified pregnancy related conditions, third trimester: Secondary | ICD-10-CM | POA: Insufficient documentation

## 2016-07-07 DIAGNOSIS — Z3689 Encounter for other specified antenatal screening: Secondary | ICD-10-CM | POA: Insufficient documentation

## 2016-07-07 DIAGNOSIS — O9989 Other specified diseases and conditions complicating pregnancy, childbirth and the puerperium: Secondary | ICD-10-CM | POA: Diagnosis not present

## 2016-07-07 DIAGNOSIS — Z3A32 32 weeks gestation of pregnancy: Secondary | ICD-10-CM | POA: Diagnosis not present

## 2016-07-07 DIAGNOSIS — R110 Nausea: Secondary | ICD-10-CM | POA: Diagnosis present

## 2016-07-07 DIAGNOSIS — R531 Weakness: Secondary | ICD-10-CM | POA: Diagnosis present

## 2016-07-07 HISTORY — DX: Polycystic ovarian syndrome: E28.2

## 2016-07-07 LAB — URINALYSIS, ROUTINE W REFLEX MICROSCOPIC
Bilirubin Urine: NEGATIVE
Glucose, UA: NEGATIVE mg/dL
Hgb urine dipstick: NEGATIVE
Ketones, ur: NEGATIVE mg/dL
Nitrite: NEGATIVE
PROTEIN: NEGATIVE mg/dL
Specific Gravity, Urine: 1.015 (ref 1.005–1.030)
pH: 6 (ref 5.0–8.0)

## 2016-07-07 LAB — GLUCOSE, CAPILLARY: Glucose-Capillary: 88 mg/dL (ref 65–99)

## 2016-07-07 NOTE — MAU Provider Note (Signed)
History     CSN: [id number]  Arrival date and time: 07/07/16 1243  G3P0020 @32 .3 weeks here with near syncope episode around 1030 this am while at work. She felt lightheaded then became diaphoretic, nauseas, and felt like passing out. She then sat down and drank water. Sx have improved since. No syncope occurred. She reports eating two plain biscuits before going to work and nothing since. She had some water as well. No pregnancy complaints, no VB, LOF, or ctx. Reports good FM. She was seen 2 days ago in ED for similar episode, workup was negative and she was treated for dehydration. [facility], and her pregnancy has been uncomplicated.     OB History    Gravida Para Term Preterm AB Living   3 0 0 0 2 0   SAB TAB Ectopic Multiple Live Births   0 0 0          Past Medical History:  Diagnosis Date  . Pregnant     No past surgical history on file.  No family history on file.  Social History  Substance Use Topics  . Smoking status: Never Smoker  . Smokeless tobacco: Not on file  . Alcohol use No    Allergies: No Known Allergies  Prescriptions Prior to Admission  Medication Sig Dispense Refill Last Dose  . amoxicillin (AMOXIL) 500 MG capsule Take 2 capsules (1,000 mg total) by mouth 3 (three) times daily. (Patient not taking: Reported on 01/18/2016) 40 capsule 0 Not Taking at Unknown time  . cephALEXin (KEFLEX) 500 MG capsule Take 1 capsule (500 mg total) by mouth 2 (two) times daily. 10 capsule 0   . doxylamine, Sleep, (UNISOM) 25 MG tablet Take 1 tablet (25 mg total) by mouth at bedtime as needed (nausea). 20 tablet 0 01/18/2016 at Unknown time  . ibuprofen (ADVIL,MOTRIN) 200 MG tablet Take 200 mg by mouth every 6 (six) hours as needed for headache.   unknown at unknown  . oxyCODONE-acetaminophen (PERCOCET/ROXICET) 5-325 MG per tablet Take 1 tablet by mouth every 6 (six) hours as needed for severe pain. (Patient not taking: Reported on  01/18/2016) 7 tablet 0 Not Taking at Unknown time  . Prenatal Vit-Fe Fumarate-FA (PRENATAL MULTIVITAMIN) TABS tablet Take 1 tablet by mouth daily at 12 noon.   01/18/2016 at Unknown time  . pyridoxine (B-6) 100 MG tablet Take 1 tablet (100 mg total) by mouth daily. 20 tablet 0 01/18/2016 at Unknown time    Review of Systems  Constitutional: Positive for diaphoresis. Negative for fever.  Cardiovascular: Negative for chest pain.  Gastrointestinal: Positive for nausea. Negative for abdominal pain.  Genitourinary: Negative for vaginal bleeding.  Neurological: Positive for light-headedness. Negative for syncope.   Physical Exam   Blood pressure 104/67, pulse 89, temperature 98.6 F (37 C), temperature source Oral, resp. rate 18, last menstrual period 11/09/2015, SpO2 98 %.  Physical Exam  Constitutional: She is oriented to person, place, and time. She appears well-developed and well-nourished. No distress.  HENT:  Head: Normocephalic and atraumatic.  Neck: Normal range of motion.  Cardiovascular: Normal rate.   Respiratory: Effort normal. No respiratory distress.  Musculoskeletal: Normal range of motion.  Neurological: She is alert and oriented to person, place, and time.  Skin: Skin is warm and dry.  Psychiatric: She has a normal mood and affect.  EFM: 135 bpm, mod variability, + accels, no decels Toco: none  Results for orders placed or performed during the  hospital encounter of 07/07/16 (from the past 24 hour(s))  Urinalysis, Routine w reflex microscopic     Status: Abnormal   Collection Time: 07/07/16  1:00 PM  Result Value Ref Range   Color, Urine AMBER (A) YELLOW   APPearance HAZY (A) CLEAR   Specific Gravity, Urine 1.015 1.005 - 1.030   pH 6.0 5.0 - 8.0   Glucose, UA NEGATIVE NEGATIVE mg/dL   Hgb urine dipstick NEGATIVE NEGATIVE   Bilirubin Urine NEGATIVE NEGATIVE   Ketones, ur NEGATIVE NEGATIVE mg/dL   Protein, ur NEGATIVE NEGATIVE mg/dL   Nitrite NEGATIVE NEGATIVE    Leukocytes, UA TRACE (A) NEGATIVE   RBC / HPF 0-5 0 - 5 RBC/hpf   WBC, UA 6-30 0 - 5 WBC/hpf   Bacteria, UA RARE (A) NONE SEEN   Squamous Epithelial / LPF 6-30 (A) NONE SEEN   Mucous PRESENT    Hyaline Casts, UA PRESENT   Glucose, capillary     Status: None   Collection Time: 07/07/16  1:57 PM  Result Value Ref Range   Glucose-Capillary 88 65 - 99 mg/dL   MAU Course  Procedures  MDM CBG ordered and reviewed. Normal workup 2 days ago, EKG and labs not repeated. Symptoms likely caused by BS fluctations d/t high carb intake and infrequent meals/snacks. Poor hydration status could also cause similar sx. Discussed meals/snacks, add protein, drink more water, frequent sitting breaks. Stable for discharge home.  Assessment and Plan   1. [redacted] weeks gestation of pregnancy   2. NST (non-stress test) reactive   3. Near syncope    Discharge home Follow up with Dr. Toney Rakes as scheduled Return precautions  Allergies as of 07/07/2016   No Known Allergies     Medication List    STOP taking these medications   amoxicillin 500 MG capsule Commonly known as:  AMOXIL   cephALEXin 500 MG capsule Commonly known as:  KEFLEX   ibuprofen 200 MG tablet Commonly known as:  ADVIL,MOTRIN   oxyCODONE-acetaminophen 5-325 MG tablet Commonly known as:  PERCOCET/ROXICET     TAKE these medications   doxylamine (Sleep) 25 MG tablet Commonly known as:  UNISOM Take 1 tablet (25 mg total) by mouth at bedtime as needed (nausea).   prenatal multivitamin Tabs tablet Take 1 tablet by mouth daily at 12 noon.   pyridoxine 100 MG tablet Commonly known as:  B-6 Take 1 tablet (100 mg total) by mouth daily.      Donette Larry, CNM 07/07/2016, 1:26 PM

## 2016-07-07 NOTE — Discharge Instructions (Signed)
Eating Plan for Pregnant Women While you are pregnant, your body will require additional nutrition to help support your growing baby. It is recommended that you consume:  150 additional calories each day during your first trimester.  300 additional calories each day during your second trimester.  300 additional calories each day during your third trimester.  Eating a healthy, well-balanced diet is very important for your health and for your baby's health. You also have a higher need for some vitamins and minerals, such as folic acid, calcium, iron, and vitamin D. What do I need to know about eating during pregnancy?  Do not try to lose weight or go on a diet during pregnancy.  Choose healthy, nutritious foods. Choose  of a sandwich with a glass of milk instead of a candy bar or a high-calorie sugar-sweetened beverage.  Limit your overall intake of foods that have "empty calories." These are foods that have little nutritional value, such as sweets, desserts, candies, sugar-sweetened beverages, and fried foods.  Eat a variety of foods, especially fruits and vegetables.  Take a prenatal vitamin to help meet the additional needs during pregnancy, specifically for folic acid, iron, calcium, and vitamin D.  Remember to stay active. Ask your health care provider for exercise recommendations that are specific to you.  Practice good food safety and cleanliness, such as washing your hands before you eat and after you prepare raw meat. This helps to prevent foodborne illnesses, such as listeriosis, that can be very dangerous for your baby. Ask your health care provider for more information about listeriosis. What does 150 extra calories look like? Healthy options for an additional 150 calories each day could be any of the following:  Plain low-fat yogurt (6-8 oz) with  cup of berries.  1 apple with 2 teaspoons of peanut butter.  Cut-up vegetables with  cup of hummus.  Low-fat chocolate  milk (8 oz or 1 cup).  1 string cheese with 1 medium orange.   of a peanut butter and jelly sandwich on whole-wheat bread (1 tsp of peanut butter).  For 300 calories, you could eat two of those healthy options each day. What is a healthy amount of weight to gain? The recommended amount of weight for you to gain is based on your pre-pregnancy BMI. If your pre-pregnancy BMI was:  Less than 18 (underweight), you should gain 28-40 lb.  18-24.9 (normal), you should gain 25-35 lb.  25-29.9 (overweight), you should gain 15-25 lb.  Greater than 30 (obese), you should gain 11-20 lb.  What if I am having twins or multiples? Generally, pregnant women who will be having twins or multiples may need to increase their daily calories by 300-600 calories each day. The recommended range for total weight gain is 25-54 lb, depending on your pre-pregnancy BMI. Talk with your health care provider for specific guidance about additional nutritional needs, weight gain, and exercise during your pregnancy. What foods can I eat? Grains Any grains. Try to choose whole grains, such as whole-wheat bread, oatmeal, or brown rice. Vegetables Any vegetables. Try to eat a variety of colors and types of vegetables to get a full range of vitamins and minerals. Remember to wash your vegetables well before eating. Fruits Any fruits. Try to eat a variety of colors and types of fruit to get a full range of vitamins and minerals. Remember to wash your fruits well before eating. Meats and Other Protein Sources Lean meats, including chicken, turkey, fish, and lean cuts of beef, veal,   or pork. Make sure that all meats are cooked to "well done." Tofu. Tempeh. Beans. Eggs. Peanut butter and other nut butters. Seafood, such as shrimp, crab, and lobster. If you choose fish, select types that are higher in omega-3 fatty acids, including salmon, herring, mussels, trout, sardines, and pollock. Make sure that all meats are cooked to  food-safe temperatures. Dairy Pasteurized milk and milk alternatives. Pasteurized yogurt and pasteurized cheese. Cottage cheese. Sour cream. Beverages Water. Juices that contain 100% fruit juice or vegetable juice. Caffeine-free teas and decaffeinated coffee. Drinks that contain caffeine are okay to drink, but it is better to avoid caffeine. Keep your total caffeine intake to less than 200 mg each day (12 oz of coffee, tea, or soda) or as directed by your health care provider. Condiments Any pasteurized condiments. Sweets and Desserts Any sweets and desserts. Fats and Oils Any fats and oils. The items listed above may not be a complete list of recommended foods or beverages. Contact your dietitian for more options. What foods are not recommended? Vegetables Unpasteurized (raw) vegetable juices. Fruits Unpasteurized (raw) fruit juices. Meats and Other Protein Sources Cured meats that have nitrates, such as bacon, salami, and hotdogs. Luncheon meats, bologna, or other deli meats (unless they are reheated until they are steaming hot). Refrigerated pate, meat spreads from a meat counter, smoked seafood that is found in the refrigerated section of a store. Raw fish, such as sushi or sashimi. High mercury content fish, such as tilefish, shark, swordfish, and king mackerel. Raw meats, such as tuna or beef tartare. Undercooked meats and poultry. Make sure that all meats are cooked to food-safe temperatures. Dairy Unpasteurized (raw) milk and any foods that have raw milk in them. Soft cheeses, such as feta, queso blanco, queso fresco, Brie, Camembert cheeses, blue-veined cheeses, and Panela cheese (unless it is made with pasteurized milk, which must be stated on the label). Beverages Alcohol. Sugar-sweetened beverages, such as sodas, teas, or energy drinks. Condiments Homemade fermented foods and drinks, such as pickles, sauerkraut, or kombucha drinks. (Store-bought pasteurized versions of these are  okay.) Other Salads that are made in the store, such as ham salad, chicken salad, egg salad, tuna salad, and seafood salad. The items listed above may not be a complete list of foods and beverages to avoid. Contact your dietitian for more information. This information is not intended to replace advice given to you by your health care provider. Make sure you discuss any questions you have with your health care provider. Document Released: 10/26/2013 Document Revised: 06/19/2015 Document Reviewed: 06/26/2013 Elsevier Interactive Patient Education  2018 [employer]   

## 2016-07-07 NOTE — Progress Notes (Addendum)
G3P0 @ 32.[redacted] wksga. Presents to triage via EMS for abdominal pain 4/10 pain level, dizziness and nausea. deniles LOF or bleeding. Normal discharge. + FM.   EFM applied. [facility]. Dx with UTI and not taking meds that was prescribed.   1315: orthostatic bps completed. See flow sheet for details. Requested tech to perform CBG.  1320: provider at bs assessing pt.   1357: provider requesting for CBG. CBG not done. Tech states will do it.   CBG 88  1404: pt sitting upright eating crackers. Positioned pt. FHR 135  Discharge instructions given with pt understanding. Pt left unit via ambulatory with SO

## 2016-11-29 ENCOUNTER — Encounter (HOSPITAL_COMMUNITY): Payer: Self-pay

## 2021-07-03 ENCOUNTER — Encounter (HOSPITAL_BASED_OUTPATIENT_CLINIC_OR_DEPARTMENT_OTHER): Payer: Self-pay | Admitting: Emergency Medicine

## 2021-07-03 ENCOUNTER — Emergency Department (HOSPITAL_BASED_OUTPATIENT_CLINIC_OR_DEPARTMENT_OTHER)
Admission: EM | Admit: 2021-07-03 | Discharge: 2021-07-03 | Disposition: A | Discharge status: Home / Self Care | Payer: Self-pay | Attending: Emergency Medicine | Admitting: Emergency Medicine

## 2021-07-03 ENCOUNTER — Emergency Department (HOSPITAL_BASED_OUTPATIENT_CLINIC_OR_DEPARTMENT_OTHER): Payer: Self-pay

## 2021-07-03 ENCOUNTER — Other Ambulatory Visit: Payer: Self-pay

## 2021-07-03 DIAGNOSIS — N760 Acute vaginitis: Secondary | ICD-10-CM | POA: Insufficient documentation

## 2021-07-03 DIAGNOSIS — B9689 Other specified bacterial agents as the cause of diseases classified elsewhere: Secondary | ICD-10-CM

## 2021-07-03 DIAGNOSIS — N12 Tubulo-interstitial nephritis, not specified as acute or chronic: Secondary | ICD-10-CM | POA: Insufficient documentation

## 2021-07-03 LAB — COMPREHENSIVE METABOLIC PANEL
ALT: 10 U/L (ref 0–44)
AST: 18 U/L (ref 15–41)
Albumin: 3.9 g/dL (ref 3.5–5.0)
Alkaline Phosphatase: 54 U/L (ref 38–126)
Anion gap: 6 (ref 5–15)
BUN: 14 mg/dL (ref 6–20)
CO2: 27 mmol/L (ref 22–32)
Calcium: 9 mg/dL (ref 8.9–10.3)
Chloride: 106 mmol/L (ref 98–111)
Creatinine, Ser: 0.93 mg/dL (ref 0.44–1.00)
GFR, Estimated: >60 mL/min (ref >60)
Glucose, Bld: 90 mg/dL (ref 70–99)
Potassium: 3.6 mmol/L (ref 3.5–5.1)
Sodium: 139 mmol/L (ref 135–145)
Total Bilirubin: 0.4 mg/dL (ref 0.3–1.2)
Total Protein: 8.3 g/dL — ABNORMAL HIGH (ref 6.5–8.1)

## 2021-07-03 LAB — CBC WITH DIFFERENTIAL/PLATELET
Abs Immature Granulocytes: 0.02 10*3/uL (ref 0.00–0.07)
Basophils Absolute: 0 10*3/uL (ref 0.0–0.1)
Basophils Relative: 0 %
Eosinophils Absolute: 0 10*3/uL (ref 0.0–0.5)
Eosinophils Relative: 0 %
HCT: 28.2 % — ABNORMAL LOW (ref 36.0–46.0)
Hemoglobin: 9.1 g/dL — ABNORMAL LOW (ref 12.0–15.0)
Immature Granulocytes: 0 %
Lymphocytes Relative: 16 %
Lymphs Abs: 1.2 10*3/uL (ref 0.7–4.0)
MCH: 29.2 pg (ref 26.0–34.0)
MCHC: 32.3 g/dL (ref 30.0–36.0)
MCV: 90.4 fL (ref 80.0–100.0)
Monocytes Absolute: 0.6 10*3/uL (ref 0.1–1.0)
Monocytes Relative: 8 %
Neutro Abs: 5.4 10*3/uL (ref 1.7–7.7)
Neutrophils Relative %: 76 %
Platelets: 355 10*3/uL (ref 150–400)
RBC: 3.12 MIL/uL — ABNORMAL LOW (ref 3.87–5.11)
RDW: 12.7 % (ref 11.5–15.5)
WBC: 7.3 10*3/uL (ref 4.0–10.5)
nRBC: 0 % (ref 0.0–0.2)

## 2021-07-03 LAB — URINALYSIS, ROUTINE W REFLEX MICROSCOPIC
Bilirubin Urine: NEGATIVE
Glucose, UA: NEGATIVE mg/dL
Ketones, ur: NEGATIVE mg/dL
Nitrite: NEGATIVE
Protein, ur: 30 mg/dL — AB
Specific Gravity, Urine: 1.015 (ref 1.005–1.030)
pH: 7 (ref 5.0–8.0)

## 2021-07-03 LAB — URINALYSIS, MICROSCOPIC (REFLEX)

## 2021-07-03 LAB — WET PREP, GENITAL
Sperm: NONE SEEN
Trich, Wet Prep: NONE SEEN
WBC, Wet Prep HPF POC: <10 (ref <10)
Yeast Wet Prep HPF POC: NONE SEEN

## 2021-07-03 LAB — LIPASE, BLOOD: Lipase: 29 U/L (ref 11–51)

## 2021-07-03 LAB — HCG, SERUM, QUALITATIVE: Preg, Serum: NEGATIVE

## 2021-07-03 MED ORDER — ONDANSETRON HCL 4 MG PO TABS: 4.0000 mg | ORAL_TABLET | Freq: Three times a day (TID) | ORAL | 0 refills | Status: AC | PRN

## 2021-07-03 MED ORDER — OXYCODONE HCL 5 MG PO TABS: 5.0000 mg | ORAL_TABLET | ORAL | 0 refills | Status: AC | PRN

## 2021-07-03 MED ORDER — ONDANSETRON HCL 4 MG/2ML IJ SOLN
4.0000 mg | Freq: Once | INTRAMUSCULAR | Status: AC
  Administered 2021-07-03: 4 mg via INTRAVENOUS
  Filled 2021-07-03: qty 2

## 2021-07-03 MED ORDER — MORPHINE SULFATE (PF) 4 MG/ML IV SOLN
4.0000 mg | Freq: Once | INTRAVENOUS | Status: AC
  Administered 2021-07-03: 4 mg via INTRAVENOUS
  Filled 2021-07-03: qty 1

## 2021-07-03 MED ORDER — CEPHALEXIN 250 MG PO CAPS: 500.0000 mg | ORAL_CAPSULE | Freq: Once | ORAL | Status: DC

## 2021-07-03 MED ORDER — SODIUM CHLORIDE 0.9 % IV BOLUS
1000.0000 mL | Freq: Once | INTRAVENOUS | Status: AC
  Administered 2021-07-03: 1000 mL via INTRAVENOUS

## 2021-07-03 MED ORDER — CEPHALEXIN 500 MG PO CAPS: 500.0000 mg | ORAL_CAPSULE | Freq: Three times a day (TID) | ORAL | 0 refills | Status: AC

## 2021-07-03 MED ORDER — METRONIDAZOLE 500 MG PO TABS: 500.0000 mg | ORAL_TABLET | Freq: Two times a day (BID) | ORAL | 0 refills | Status: AC

## 2021-07-03 MED ORDER — KETOROLAC TROMETHAMINE 30 MG/ML IJ SOLN
30.0000 mg | Freq: Once | INTRAMUSCULAR | Status: AC
Start: 2021-07-03 — End: 2021-07-03
  Administered 2021-07-03: 30 mg via INTRAVENOUS
  Filled 2021-07-03: qty 1

## 2021-07-03 MED ORDER — IOHEXOL 300 MG/ML  SOLN
100.0000 mL | Freq: Once | INTRAMUSCULAR | Status: AC | PRN
Start: 2021-07-03 — End: 2021-07-03
  Administered 2021-07-03: 100 mL via INTRAVENOUS

## 2021-07-03 NOTE — ED Provider Notes (Signed)
[facility] EMERGENCY DEPARTMENT Provider Note   CSN: [id number] Arrival date & time: 07/03/21  1738     History  Chief Complaint  Patient presents with   Flank Pain    Left     Allison Barnett is a 31 y.o. female past medical history here for evaluation of right-sided abdominal pain and flank pain.  Began on Monday. Located diffusely to right abdomen.  Of note she did have abortion pill approximately a week and a half ago.  Still with some intermittent bleeding.  No vaginal discharge, pelvic pain. No syncope.  Has not followed with OB/GYN.  Has gallbladder, appendix still.  Pain not worse with food intake.  Some component of worsening with movement hx PCOS.  Has been urinating more frequently.  No nausea, vomiting, diarrhea, chest pain, shortness of breath, cough.  No meds PTA.  HPI     Home Medications Prior to Admission medications   Medication Sig Start Date End Date Taking? Authorizing Provider  cephALEXin (KEFLEX) 500 MG capsule Take 1 capsule (500 mg total) by mouth 3 (three) times daily. 07/03/21  Yes Tevita Gomer A, PA-C  metroNIDAZOLE (FLAGYL) 500 MG tablet Take 1 tablet (500 mg total) by mouth 2 (two) times daily. 07/03/21  Yes Tarena Gockley A, PA-C  ondansetron (ZOFRAN) 4 MG tablet Take 1 tablet (4 mg total) by mouth every 8 (eight) hours as needed for nausea or vomiting. 07/03/21  Yes Javari Bufkin A, PA-C  oxyCODONE (ROXICODONE) 5 MG immediate release tablet Take 1 tablet (5 mg total) by mouth every 4 (four) hours as needed for severe pain. 07/03/21  Yes Yecenia Dalgleish A, PA-C  Prenatal Vit-Fe Fumarate-FA (PRENATAL MULTIVITAMIN) TABS tablet Take 1 tablet by mouth daily at 12 noon.    [provider]      Allergies    Latex    Review of Systems   Review of Systems  Constitutional: Negative.   HENT: Negative.    Respiratory: Negative.    Cardiovascular: Negative.   Gastrointestinal:  Positive for abdominal pain. Negative for abdominal  distention, anal bleeding, blood in stool, constipation, diarrhea, nausea, rectal pain and vomiting.  Genitourinary:  Positive for flank pain and frequency. Negative for decreased urine volume, difficulty urinating, dysuria, hematuria, menstrual problem, pelvic pain, urgency, vaginal bleeding, vaginal discharge and vaginal pain.  Musculoskeletal:  Negative for arthralgias and back pain.  Skin: Negative.   Neurological: Negative.   All other systems reviewed and are negative.   Physical Exam Updated Vital Signs BP 105/88   Pulse 82   Temp 98.7 F (37.1 C) (Oral)   Resp 16   Ht 5\' 6"  (1.676 m)   SpO2 99%   BMI 25.82 kg/m  Physical Exam Vitals and nursing note reviewed. Exam conducted with a chaperone present.  Constitutional:      General: She is not in acute distress.    Appearance: She is well-developed. She is not ill-appearing, toxic-appearing or diaphoretic.  HENT:     Head: Normocephalic and atraumatic.     Nose: Nose normal.     Mouth/Throat:     Mouth: Mucous membranes are moist.  Eyes:     Pupils: Pupils are equal, round, and reactive to light.  Cardiovascular:     Rate and Rhythm: Normal rate.     Pulses: Normal pulses.     Heart sounds: Normal heart sounds.  Pulmonary:     Effort: Pulmonary effort is normal. No respiratory distress.     Breath  sounds: Normal breath sounds.  Abdominal:     General: Bowel sounds are normal. There is no distension.     Palpations: Abdomen is soft.     Tenderness: There is abdominal tenderness. There is no right CVA tenderness, left CVA tenderness, guarding or rebound.     Comments: Diffuse right-sided abominal pain, positive CVA tap on right  Genitourinary:    Comments: Normal appearing external female genitalia without rashes or lesions, normal vaginal epithelium. Normal appearing cervix without discharge or petechiae. Cervical os is closed. There is scant old bleeding noted at the os.No Odor. Bimanual: No CMT, non-tender.  No  palpable adnexal masses or tenderness. Uterus midline and not fixed. Rectovaginal exam was deferred.  No cystocele or rectocele noted. No pelvic lymphadenopathy noted. Wet prep was obtained.  Cultures for gonorrhea and chlamydia collected. Exam performed with chaperone in room.   Musculoskeletal:        General: No swelling, tenderness, deformity or signs of injury. Normal range of motion.     Cervical back: Normal range of motion.     Right lower leg: No edema.     Left lower leg: No edema.     Comments: Moves all 4 extremities, compartments soft.  Bony tenderness  Skin:    General: Skin is warm and dry.     Capillary Refill: Capillary refill takes less than 2 seconds.  Neurological:     General: No focal deficit present.     Mental Status: She is alert.  Psychiatric:        Mood and Affect: Mood normal.    ED Results / Procedures / Treatments   Labs (all labs ordered are listed, but only abnormal results are displayed) Labs Reviewed  WET PREP, GENITAL - Abnormal; Notable for the following components:      Result Value   Clue Cells Wet Prep HPF POC PRESENT (*)    All other components within normal limits  CBC WITH DIFFERENTIAL/PLATELET - Abnormal; Notable for the following components:   RBC 3.12 (*)    Hemoglobin 9.1 (*)    HCT 28.2 (*)    All other components within normal limits  COMPREHENSIVE METABOLIC PANEL - Abnormal; Notable for the following components:   Total Protein 8.3 (*)    All other components within normal limits  URINALYSIS, ROUTINE W REFLEX MICROSCOPIC - Abnormal; Notable for the following components:   Hgb urine dipstick LARGE (*)    Protein, ur 30 (*)    Leukocytes,Ua SMALL (*)    All other components within normal limits  URINALYSIS, MICROSCOPIC (REFLEX) - Abnormal; Notable for the following components:   Bacteria, UA MANY (*)    All other components within normal limits  LIPASE, BLOOD  HCG, SERUM, QUALITATIVE  GC/CHLAMYDIA PROBE AMP ([place]) NOT  AT [facility]    EKG None  Radiology CT ABDOMEN PELVIS W CONTRAST  Result Date: 07/03/2021 CLINICAL DATA:  RLQ abdominal pain (Age >= 14y) right upper and lower abd pain EXAM: CT ABDOMEN AND PELVIS WITH CONTRAST TECHNIQUE: Multidetector CT imaging of the abdomen and pelvis was performed using the standard protocol following bolus administration of intravenous contrast. RADIATION DOSE REDUCTION: This exam was performed according to the departmental dose-optimization program which includes automated exposure control, adjustment of the mA and/or kV according to patient size and/or use of iterative reconstruction technique. CONTRAST:  OMNIPAQUE IOHEXOL 300 MG/ML  SOLN COMPARISON:  None Available. FINDINGS: Lower chest: No acute abnormality Hepatobiliary: No focal hepatic abnormality. Gallbladder  unremarkable. Pancreas: No focal abnormality or ductal dilatation. Spleen: No focal abnormality.  Normal size. Adrenals/Urinary Tract: No adrenal abnormality. No focal renal abnormality. Unable to assess for stones with contrast in the collecting systems bilaterally. No hydronephrosis. Urinary bladder is unremarkable. Stomach/Bowel: Normal appendix. Stomach, large and small bowel grossly unremarkable. Vascular/Lymphatic: No evidence of aneurysm or adenopathy. Reproductive: Uterus and adnexa unremarkable.  No mass. Other: No free fluid or free air. Musculoskeletal: No acute bony abnormality. IMPRESSION: Normal appendix. No acute findings in the abdomen or pelvis. Electronically Signed   By: Charlett Nose M.D.   On: 07/03/2021 21:20    Procedures Procedures    Medications Ordered in ED Medications  cephALEXin (KEFLEX) capsule 500 mg (has no administration in time range)  sodium chloride 0.9 % bolus 1,000 mL (0 mLs Intravenous Stopped 07/03/21 2135)  ondansetron (ZOFRAN) injection 4 mg (4 mg Intravenous Given 07/03/21 1929)  morphine (PF) 4 MG/ML injection 4 mg (4 mg Intravenous Given 07/03/21 1928)  iohexol  (OMNIPAQUE) 300 MG/ML solution 100 mL (100 mLs Intravenous Contrast Given 07/03/21 2059)  morphine (PF) 4 MG/ML injection 4 mg (4 mg Intravenous Given 07/03/21 2211)  ketorolac (TORADOL) 30 MG/ML injection 30 mg (30 mg Intravenous Given 07/03/21 2210)   ED Course/ Medical Decision Making/ A&P    31 year old here for evaluation of his right-sided abdominal pain and flank pain which began 4 days ago.  Not associate with food intake.  Worse with movement.  Associated urinary frequency.  No hematuria.  Patient did have medical abortion approximately 3 and half weeks ago.  Has had some intermittent bleeding since.  No pelvic pain, vaginal discharge, concern for STD.  No chest pain, shortness of breath.  No tachypnea, tachycardia or hypoxia.  She is PERC negative.  No upper respiratory symptoms to suggest PE, pneumonia, pneumothorax as cause of her flank and abdominal pain.  We will plan on labs, imaging and reassess  Labs and imaging personally viewed and interpreted:  CBC without leukocytosis, hemoglobin 9.1, prior 9.5 Metabolic panel without significant abnormality Lipase 29 Wet prep with BV UA positive for infection Pregnancy test negative CT AP without significant abnormality  GU exam with scant old blood at cervical os, no CMT, adnexal tenderness.  Low suspicion for PID, TOA, torsion.  Discussed results with patient.  Suspect patient has pyelonephritis as a cause of her pain.  Her pain is currently well controlled.  Will DC home with antibiotics, pain control, antipyretic.  She was given first dose of antibiotics here in the emergency department.  I discussed close follow-up with her PCP for reassessment early next week as well as follow-up with OB/GYN for her recent TAB.  Pregnancy test here is negative I have low suspicion for retained products of conception.  On repeat exam patient does not have a surgical abdomin and there are no peritoneal signs.  No indication of appendicitis, bowel  obstruction, bowel perforation, cholecystitis, diverticulitis, PID, TOA, torsion or ectopic pregnancy.    The patient has been appropriately medically screened and/or stabilized in the ED. I have low suspicion for any other emergent medical condition which would require further screening, evaluation or treatment in the ED or require inpatient management.  Patient is hemodynamically stable and in no acute distress.  Patient able to ambulate in department prior to ED.  Evaluation does not show acute pathology that would require ongoing or additional emergent interventions while in the emergency department or further inpatient treatment.  I have discussed the diagnosis with  the patient and answered all questions.  Pain is been managed while in the emergency department and patient has no further complaints prior to discharge.  Patient is comfortable with plan discussed in room and is stable for discharge at this time.  I have discussed strict return precautions for returning to the emergency department.  Patient was encouraged to follow-up with PCP/specialist refer to at discharge.                            Medical Decision Making Amount and/or Complexity of Data Reviewed External Data Reviewed: labs, radiology and notes. Labs: ordered. Decision-making details documented in ED Course. Radiology: ordered and independent interpretation performed. Decision-making details documented in ED Course.  Risk OTC drugs. Prescription drug management. Parenteral controlled substances. Diagnosis or treatment significantly limited by social determinants of health.          Final Clinical Impression(s) / ED Diagnoses Final diagnoses:  Pyelonephritis  Bacterial vaginosis    Rx / DC Orders ED Discharge Orders          Ordered    cephALEXin (KEFLEX) 500 MG capsule  3 times daily        07/03/21 2242    oxyCODONE (ROXICODONE) 5 MG immediate release tablet  Every 4 hours PRN        07/03/21 2242     ondansetron (ZOFRAN) 4 MG tablet  Every 8 hours PRN        07/03/21 2242    metroNIDAZOLE (FLAGYL) 500 MG tablet  2 times daily        07/03/21 2254              Shawnta Zimbelman A, PA-C 07/03/21 2301    Gwyneth Sprout, MD 07/06/21 2201

## 2021-07-03 NOTE — Discharge Instructions (Signed)
It was a pleasure taking care of you here in the emergency department today  You likely have an infection in your kidney.  We have sent to in a prescription for antibiotics as well as a pain medicine and nausea medicine.  Follow-up with primary care provider  Return for any new or worsening symptoms.

## 2021-07-03 NOTE — ED Triage Notes (Addendum)
Left flank pain x 5 days , denies Hx kidney stone . Hx UTI. Denies urinary symptoms . Denies NVD.  Adds abortion on April , vaginal bleeding today , passing big clots

## 2021-07-06 LAB — GC/CHLAMYDIA PROBE AMP (~~LOC~~) NOT AT ARMC
Chlamydia: POSITIVE — AB
Comment: NEGATIVE
Comment: NORMAL
Neisseria Gonorrhea: NEGATIVE

## 2021-07-08 ENCOUNTER — Telehealth (HOSPITAL_COMMUNITY): Payer: Self-pay

## 2021-07-08 MED ORDER — DOXYCYCLINE HYCLATE 100 MG PO CAPS: 100.0000 mg | ORAL_CAPSULE | Freq: Two times a day (BID) | ORAL | 0 refills | Status: AC

## 2021-09-13 ENCOUNTER — Encounter (HOSPITAL_COMMUNITY): Payer: Self-pay

## 2021-09-13 ENCOUNTER — Other Ambulatory Visit: Payer: Self-pay

## 2021-09-13 ENCOUNTER — Emergency Department (HOSPITAL_COMMUNITY)
Admission: EM | Admit: 2021-09-13 | Discharge: 2021-09-13 | Disposition: A | Discharge status: Home / Self Care | Payer: Self-pay | Attending: Emergency Medicine | Admitting: Emergency Medicine

## 2021-09-13 DIAGNOSIS — N9489 Other specified conditions associated with female genital organs and menstrual cycle: Secondary | ICD-10-CM | POA: Insufficient documentation

## 2021-09-13 DIAGNOSIS — Z91040 Latex allergy status: Secondary | ICD-10-CM | POA: Insufficient documentation

## 2021-09-13 DIAGNOSIS — T675XXA Heat exhaustion, unspecified, initial encounter: Secondary | ICD-10-CM | POA: Insufficient documentation

## 2021-09-13 LAB — CBC WITH DIFFERENTIAL/PLATELET
Abs Immature Granulocytes: 0 10*3/uL (ref 0.00–0.07)
Basophils Absolute: 0 10*3/uL (ref 0.0–0.1)
Basophils Relative: 0 %
Eosinophils Absolute: 0 10*3/uL (ref 0.0–0.5)
Eosinophils Relative: 0 %
HCT: 29.5 % — ABNORMAL LOW (ref 36.0–46.0)
Hemoglobin: 9.1 g/dL — ABNORMAL LOW (ref 12.0–15.0)
Immature Granulocytes: 0 %
Lymphocytes Relative: 30 %
Lymphs Abs: 1.2 10*3/uL (ref 0.7–4.0)
MCH: 25.3 pg — ABNORMAL LOW (ref 26.0–34.0)
MCHC: 30.8 g/dL (ref 30.0–36.0)
MCV: 82.2 fL (ref 80.0–100.0)
Monocytes Absolute: 0.3 10*3/uL (ref 0.1–1.0)
Monocytes Relative: 9 %
Neutro Abs: 2.4 10*3/uL (ref 1.7–7.7)
Neutrophils Relative %: 61 %
Platelets: 288 10*3/uL (ref 150–400)
RBC: 3.59 MIL/uL — ABNORMAL LOW (ref 3.87–5.11)
RDW: 18.1 % — ABNORMAL HIGH (ref 11.5–15.5)
WBC: 3.9 10*3/uL — ABNORMAL LOW (ref 4.0–10.5)
nRBC: 0 % (ref 0.0–0.2)

## 2021-09-13 LAB — COMPREHENSIVE METABOLIC PANEL
ALT: 10 U/L (ref 0–44)
AST: 15 U/L (ref 15–41)
Albumin: 3.7 g/dL (ref 3.5–5.0)
Alkaline Phosphatase: 35 U/L — ABNORMAL LOW (ref 38–126)
Anion gap: 7 (ref 5–15)
BUN: 13 mg/dL (ref 6–20)
CO2: 21 mmol/L — ABNORMAL LOW (ref 22–32)
Calcium: 8.6 mg/dL — ABNORMAL LOW (ref 8.9–10.3)
Chloride: 108 mmol/L (ref 98–111)
Creatinine, Ser: 0.8 mg/dL (ref 0.44–1.00)
GFR, Estimated: >60 mL/min (ref >60)
Glucose, Bld: 91 mg/dL (ref 70–99)
Potassium: 3.7 mmol/L (ref 3.5–5.1)
Sodium: 136 mmol/L (ref 135–145)
Total Bilirubin: 0.4 mg/dL (ref 0.3–1.2)
Total Protein: 7.3 g/dL (ref 6.5–8.1)

## 2021-09-13 LAB — HCG, SERUM, QUALITATIVE: Preg, Serum: NEGATIVE

## 2021-09-13 LAB — TROPONIN I (HIGH SENSITIVITY): Troponin I (High Sensitivity): <2 ng/L (ref <18)

## 2021-09-13 LAB — CK: Total CK: 51 U/L (ref 38–234)

## 2021-09-13 MED ORDER — SODIUM CHLORIDE 0.9 % IV BOLUS
1000.0000 mL | Freq: Once | INTRAVENOUS | Status: AC
  Administered 2021-09-13: 1000 mL via INTRAVENOUS

## 2021-09-13 NOTE — ED Provider Notes (Signed)
[facility]-EMERGENCY DEPT Provider Note   CSN: [id number] Arrival date & time: 09/13/21  1928     History  Chief Complaint  Patient presents with   Loss of Consciousness    Allison Barnett is a 30 y.o. female history of UTI here presenting with loss of consciousness.  Patient states that she went [place] the food truck [place].  She states that she felt very lightheaded and dizzy and passed out.  Her boyfriend was next to her and he was able to lower her to the floor.  She woke up in the ambulance.  No seizure-like activity.  She did not urinate on herself.  She states that she did eat some food earlier but felt nauseated.  EMS noticed that she was hypotensive was given fluids prior to arrival.  The history is provided by the patient.       Home Medications Prior to Admission medications   Medication Sig Start Date End Date Taking? Authorizing Provider  cephALEXin (KEFLEX) 500 MG capsule Take 1 capsule (500 mg total) by mouth 3 (three) times daily. 07/03/21   Henderly, Britni A, PA-C  metroNIDAZOLE (FLAGYL) 500 MG tablet Take 1 tablet (500 mg total) by mouth 2 (two) times daily. 07/03/21   Henderly, Britni A, PA-C  ondansetron (ZOFRAN) 4 MG tablet Take 1 tablet (4 mg total) by mouth every 8 (eight) hours as needed for nausea or vomiting. 07/03/21   Henderly, Britni A, PA-C  oxyCODONE (ROXICODONE) 5 MG immediate release tablet Take 1 tablet (5 mg total) by mouth every 4 (four) hours as needed for severe pain. 07/03/21   Henderly, Britni A, PA-C  Prenatal Vit-Fe Fumarate-FA (PRENATAL MULTIVITAMIN) TABS tablet Take 1 tablet by mouth daily at 12 noon.    [provider]      Allergies    Latex    Review of Systems   Review of Systems  Neurological:  Positive for dizziness and syncope.  All other systems reviewed and are negative.   Physical Exam Updated Vital Signs BP 112/78 (BP Location: Right Arm)   Pulse 71   Temp 97.9 F (36.6 C) (Oral)   Resp 12    LMP 08/14/2021 (Approximate)   SpO2 100%  Physical Exam Vitals and nursing note reviewed.  Constitutional:      Comments: Slightly dehydrated  HENT:     Head: Normocephalic.     Nose: Nose normal.     Mouth/Throat:     Mouth: Mucous membranes are dry.  Eyes:     Extraocular Movements: Extraocular movements intact.     Pupils: Pupils are equal, round, and reactive to light.  Cardiovascular:     Rate and Rhythm: Normal rate and regular rhythm.     Pulses: Normal pulses.     Heart sounds: Normal heart sounds.  Pulmonary:     Effort: Pulmonary effort is normal.     Breath sounds: Normal breath sounds.  Abdominal:     General: Abdomen is flat.     Palpations: Abdomen is soft.  Musculoskeletal:        General: Normal range of motion.     Cervical back: Normal range of motion and neck supple.  Skin:    General: Skin is warm.     Capillary Refill: Capillary refill takes less than 2 seconds.  Neurological:     General: No focal deficit present.     Mental Status: She is oriented to person, place, and time.  Psychiatric:  Mood and Affect: Mood normal.        Behavior: Behavior normal.     ED Results / Procedures / Treatments   Labs (all labs ordered are listed, but only abnormal results are displayed) Labs Reviewed  CBC WITH DIFFERENTIAL/PLATELET  COMPREHENSIVE METABOLIC PANEL  HCG, SERUM, QUALITATIVE  CK  TROPONIN I (HIGH SENSITIVITY)    EKG EKG Interpretation  Date/Time:  Sunday September 13 2021 19:39:25 EDT Ventricular Rate:  68 PR Interval:  164 QRS Duration: 77 QT Interval:  402 QTC Calculation: 428 R Axis:   -3 Text Interpretation: Sinus rhythm Low voltage, precordial leads No significant change since last tracing Confirmed by Richardean Canal [phone]) on 09/13/2021 7:52:09 PM  Radiology No results found.  Procedures Procedures    Medications Ordered in ED Medications  sodium chloride 0.9 % bolus 1,000 mL (has no administration in time range)    ED  Course/ Medical Decision Making/ A&P                           Medical Decision Making Allison Barnett is a 31 y.o. female here presenting with dizziness.  Patient likely has heat exhaustion from being out in the heat.  Also consider pregnancy or renal failure as well.  Plan to get CBC and CMP and CK level and pregnancy test.  We will hydrate and reassess  9:56 PM Pregnancy test is negative.  Hemoglobin is 9.1.  CMP unremarkable.  Patient is feeling much better now.  Blood pressure is normal.  Stable for discharge.  Likely heat exhaustion.  Problems Addressed: Heat exhaustion, initial encounter: acute illness or injury  Amount and/or Complexity of Data Reviewed Labs: ordered. Decision-making details documented in ED Course. ECG/medicine tests: ordered and independent interpretation performed. Decision-making details documented in ED Course.    Final Clinical Impression(s) / ED Diagnoses Final diagnoses:  None    Rx / DC Orders ED Discharge Orders     None         Charlynne Pander, MD 09/13/21 2158

## 2021-09-13 NOTE — ED Triage Notes (Signed)
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 Hypotensive with EMS originally, given 300cc bolus prior to arrival.

## 2021-09-13 NOTE — Discharge Instructions (Signed)
Please stay hydrated.  Avoid being in the heat.  See your doctor for follow-up  Return to ER if you have another episode of passing out, vomiting, severe abdominal pain, fever

## 2022-08-01 ENCOUNTER — Other Ambulatory Visit: Payer: Self-pay

## 2022-08-01 ENCOUNTER — Emergency Department (HOSPITAL_COMMUNITY): Payer: BC Managed Care – PPO

## 2022-08-01 ENCOUNTER — Emergency Department (HOSPITAL_COMMUNITY)
Admission: EM | Admit: 2022-08-01 | Discharge: 2022-08-01 | Disposition: A | Discharge status: Home / Self Care | Payer: BC Managed Care – PPO | Attending: Emergency Medicine | Admitting: Emergency Medicine

## 2022-08-01 ENCOUNTER — Encounter (HOSPITAL_COMMUNITY): Payer: Self-pay

## 2022-08-01 DIAGNOSIS — R1030 Lower abdominal pain, unspecified: Secondary | ICD-10-CM | POA: Insufficient documentation

## 2022-08-01 DIAGNOSIS — G43C0 Periodic headache syndromes in child or adult, not intractable: Secondary | ICD-10-CM | POA: Insufficient documentation

## 2022-08-01 DIAGNOSIS — O219 Vomiting of pregnancy, unspecified: Secondary | ICD-10-CM | POA: Diagnosis present

## 2022-08-01 DIAGNOSIS — Z3A09 9 weeks gestation of pregnancy: Secondary | ICD-10-CM | POA: Insufficient documentation

## 2022-08-01 LAB — URINALYSIS, ROUTINE W REFLEX MICROSCOPIC
Bilirubin Urine: NEGATIVE
Glucose, UA: NEGATIVE mg/dL
Hgb urine dipstick: NEGATIVE
Ketones, ur: 80 mg/dL — AB
Nitrite: NEGATIVE
Protein, ur: 30 mg/dL — AB
Specific Gravity, Urine: 1.029 (ref 1.005–1.030)
pH: 6 (ref 5.0–8.0)

## 2022-08-01 LAB — HEPATIC FUNCTION PANEL
ALT: 15 U/L (ref 0–44)
AST: 17 U/L (ref 15–41)
Albumin: 3.9 g/dL (ref 3.5–5.0)
Alkaline Phosphatase: 40 U/L (ref 38–126)
Bilirubin, Direct: <0.1 mg/dL (ref 0.0–0.2)
Total Bilirubin: 0.3 mg/dL (ref 0.3–1.2)
Total Protein: 8.1 g/dL (ref 6.5–8.1)

## 2022-08-01 LAB — CBC WITH DIFFERENTIAL/PLATELET
Abs Immature Granulocytes: 0.01 10*3/uL (ref 0.00–0.07)
Basophils Absolute: 0 10*3/uL (ref 0.0–0.1)
Basophils Relative: 0 %
Eosinophils Absolute: 0 10*3/uL (ref 0.0–0.5)
Eosinophils Relative: 0 %
HCT: 36.2 % (ref 36.0–46.0)
Hemoglobin: 11.8 g/dL — ABNORMAL LOW (ref 12.0–15.0)
Immature Granulocytes: 0 %
Lymphocytes Relative: 22 %
Lymphs Abs: 0.8 10*3/uL (ref 0.7–4.0)
MCH: 29.3 pg (ref 26.0–34.0)
MCHC: 32.6 g/dL (ref 30.0–36.0)
MCV: 89.8 fL (ref 80.0–100.0)
Monocytes Absolute: 0.3 10*3/uL (ref 0.1–1.0)
Monocytes Relative: 9 %
Neutro Abs: 2.4 10*3/uL (ref 1.7–7.7)
Neutrophils Relative %: 69 %
Platelets: 273 10*3/uL (ref 150–400)
RBC: 4.03 MIL/uL (ref 3.87–5.11)
RDW: 15.5 % (ref 11.5–15.5)
WBC: 3.5 10*3/uL — ABNORMAL LOW (ref 4.0–10.5)
nRBC: 0 % (ref 0.0–0.2)

## 2022-08-01 LAB — HCG, QUANTITATIVE, PREGNANCY: hCG, Beta Chain, Quant, S: 147881 m[IU]/mL — ABNORMAL HIGH (ref <5)

## 2022-08-01 LAB — BASIC METABOLIC PANEL
Anion gap: 7 (ref 5–15)
BUN: 8 mg/dL (ref 6–20)
CO2: 22 mmol/L (ref 22–32)
Calcium: 9 mg/dL (ref 8.9–10.3)
Chloride: 104 mmol/L (ref 98–111)
Creatinine, Ser: 0.63 mg/dL (ref 0.44–1.00)
GFR, Estimated: >60 mL/min (ref >60)
Glucose, Bld: 97 mg/dL (ref 70–99)
Potassium: 3.7 mmol/L (ref 3.5–5.1)
Sodium: 133 mmol/L — ABNORMAL LOW (ref 135–145)

## 2022-08-01 LAB — MAGNESIUM: Magnesium: 1.8 mg/dL (ref 1.7–2.4)

## 2022-08-01 MED ORDER — DEXTROSE 5 % IV BOLUS
1000.0000 mL | Freq: Once | INTRAVENOUS | Status: AC
  Administered 2022-08-01: 1000 mL via INTRAVENOUS

## 2022-08-01 MED ORDER — ONDANSETRON HCL 4 MG/2ML IJ SOLN
4.0000 mg | Freq: Once | INTRAMUSCULAR | Status: AC
  Administered 2022-08-01: 4 mg via INTRAVENOUS
  Filled 2022-08-01: qty 2

## 2022-08-01 MED ORDER — ACETAMINOPHEN 325 MG PO TABS
650.0000 mg | ORAL_TABLET | Freq: Once | ORAL | Status: AC
  Administered 2022-08-01: 650 mg via ORAL
  Filled 2022-08-01: qty 2

## 2022-08-01 MED ORDER — LACTATED RINGERS IV BOLUS
1000.0000 mL | Freq: Once | INTRAVENOUS | Status: AC
  Administered 2022-08-01: 1000 mL via INTRAVENOUS

## 2022-08-01 MED ORDER — DOXYLAMINE-PYRIDOXINE 10-10 MG PO TBEC: 1.0000 | DELAYED_RELEASE_TABLET | Freq: Every day | ORAL | 0 refills | Status: AC

## 2022-08-01 MED ORDER — BUTALBITAL-APAP-CAFFEINE 50-325-40 MG PO TABS
1.0000 | ORAL_TABLET | Freq: Once | ORAL | Status: AC
  Administered 2022-08-01: 1 via ORAL
  Filled 2022-08-01: qty 1

## 2022-08-01 NOTE — ED Triage Notes (Signed)
Pt reports being [redacted] weeks pregnant, having migraines x2 days, vomiting every hour, having abdominal pain. Has not had initial OB visit. Requesting any discussion about her hx of herpes not be discussed with visitors. States it is not active.

## 2022-08-01 NOTE — ED Provider Notes (Signed)
[facility]PART[facility] AT [facility] Provider Note   CSN: [id number] Arrival date & time: 08/01/22  1322     History  Chief Complaint  Patient presents with   Emesis During Pregnancy   Migraine    Allison Barnett is a 32 y.o. female.  Patient is a 32 year old female at approximately [redacted] weeks gestation by LMP presenting to the emergency department with headache and vomiting.  Patient states that she started to develop a migraine headache 2 days ago with associated photophobia and last night started to develop nausea and vomiting.  The patient states that she does have a history of migraines but has not had a migraine headache in a while.  She states that this does feel similar to prior migraines in the past.  She denies any numbness, weakness or vision changes, fevers or chills.  She states that she has had lower abdominal pain throughout this pregnancy that has worsened in the last day since she started vomiting.  She denies any vaginal bleeding or leakage of fluid, dysuria or hematuria.  She states that she has not yet had a ultrasound to confirm her pregnancy.  The history is provided by the patient and the spouse.  Migraine       Home Medications Prior to Admission medications   Medication Sig Start Date End Date Taking? Authorizing Provider  Doxylamine-Pyridoxine 10-10 MG TBEC Take 1 tablet by mouth at bedtime. 08/01/22  Yes Theresia Lo, Turkey K, DO  cephALEXin (KEFLEX) 500 MG capsule Take 1 capsule (500 mg total) by mouth 3 (three) times daily. 07/03/21   Henderly, Britni A, PA-C  metroNIDAZOLE (FLAGYL) 500 MG tablet Take 1 tablet (500 mg total) by mouth 2 (two) times daily. 07/03/21   Henderly, Britni A, PA-C  ondansetron (ZOFRAN) 4 MG tablet Take 1 tablet (4 mg total) by mouth every 8 (eight) hours as needed for nausea or vomiting. 07/03/21   Henderly, Britni A, PA-C  oxyCODONE (ROXICODONE) 5 MG immediate release tablet Take 1 tablet (5 mg total) by mouth every  4 (four) hours as needed for severe pain. 07/03/21   Henderly, Britni A, PA-C  Prenatal Vit-Fe Fumarate-FA (PRENATAL MULTIVITAMIN) TABS tablet Take 1 tablet by mouth daily at 12 noon.    [provider]      Allergies    Latex    Review of Systems   Review of Systems  Physical Exam Updated Vital Signs BP (!) 95/57   Pulse 70   Temp 98.4 F (36.9 C) (Oral)   Resp 17   Ht 5\' 7"  (1.702 m)   Wt 72.6 kg   LMP 08/14/2021 (Approximate)   SpO2 100%   BMI 25.06 kg/m  Physical Exam Vitals and nursing note reviewed.  Constitutional:      General: She is not in acute distress.    Appearance: Normal appearance.     Comments: Sitting in a dark room.  HENT:     Head: Normocephalic and atraumatic.     Nose: Nose normal.     Mouth/Throat:     Mouth: Mucous membranes are moist.     Pharynx: Oropharynx is clear.  Eyes:     Extraocular Movements: Extraocular movements intact.     Conjunctiva/sclera: Conjunctivae normal.     Pupils: Pupils are equal, round, and reactive to light.  Cardiovascular:     Rate and Rhythm: Normal rate and regular rhythm.     Heart sounds: Normal heart sounds.  Pulmonary:  Effort: Pulmonary effort is normal.     Breath sounds: Normal breath sounds.  Abdominal:     General: Abdomen is flat.     Palpations: Abdomen is soft.     Tenderness: There is abdominal tenderness (Suprapubic). There is guarding. There is no rebound.  Musculoskeletal:        General: Normal range of motion.     Cervical back: Normal range of motion and neck supple.  Skin:    General: Skin is warm and dry.  Neurological:     General: No focal deficit present.     Mental Status: She is alert and oriented to person, place, and time.     Cranial Nerves: No cranial nerve deficit.     Sensory: No sensory deficit.     Motor: No weakness.  Psychiatric:        Mood and Affect: Mood normal.        Behavior: Behavior normal.     ED Results / Procedures / Treatments    Labs (all labs ordered are listed, but only abnormal results are displayed) Labs Reviewed  BASIC METABOLIC PANEL - Abnormal; Notable for the following components:      Result Value   Sodium 133 (*)    All other components within normal limits  HCG, QUANTITATIVE, PREGNANCY - Abnormal; Notable for the following components:   hCG, Beta Chain, Quant, [place] 161,096 (*)    All other components within normal limits  CBC WITH DIFFERENTIAL/PLATELET - Abnormal; Notable for the following components:   WBC 3.5 (*)    Hemoglobin 11.8 (*)    All other components within normal limits  URINALYSIS, ROUTINE W REFLEX MICROSCOPIC - Abnormal; Notable for the following components:   APPearance CLOUDY (*)    Ketones, ur 80 (*)    Protein, ur 30 (*)    Leukocytes,Ua TRACE (*)    Bacteria, UA RARE (*)    All other components within normal limits  MAGNESIUM  HEPATIC FUNCTION PANEL    EKG None  Radiology US OB Comp < 14 Wks  Result Date: 08/01/2022 CLINICAL DATA:  Pregnant patient in first-trimester pregnancy with abdominal pain. EXAM: OBSTETRIC <14 WK ULTRASOUND TECHNIQUE: Transabdominal ultrasound was performed for evaluation of the gestation as well as the maternal uterus and adnexal regions. COMPARISON:  None available this pregnancy. FINDINGS: Intrauterine gestational sac: Single Yolk sac:  Visualized. Embryo:  Visualized. Cardiac Activity: Visualized. Heart Rate: 167 bpm CRL:   24 mm   9 w 1 d                  [place] EDC: 03/05/2023 Subchorionic hemorrhage:  None visualized. Maternal uterus/adnexae: Both ovaries are visualized and are normal. Ovarian blood flow is demonstrated. No cyst or adnexal mass. No pelvic free fluid. IMPRESSION: Single live intrauterine pregnancy estimated gestational age [redacted] weeks 1 day based on crown-rump length for ultrasound [facility] 03/05/2023. No subchorionic hemorrhage or explanation for pain. Electronically Signed   By: Narda Rutherford M.D.   On: 08/01/2022 16:31     Procedures Procedures    Medications Ordered in ED Medications  lactated ringers bolus 1,000 mL (0 mLs Intravenous Stopped 08/01/22 1610)  ondansetron (ZOFRAN) injection 4 mg (4 mg Intravenous Given 08/01/22 1442)  acetaminophen (TYLENOL) tablet 650 mg (650 mg Oral Given 08/01/22 1441)  dextrose 5 % bolus 1,000 mL (0 mLs Intravenous Stopped 08/01/22 1732)  butalbital-acetaminophen-caffeine (FIORICET) 50-325-40 MG per tablet 1 tablet (1 tablet Oral Given 08/01/22 1735)  ED Course/ Medical Decision Making/ A&P Clinical Course as of 08/01/22 1850  Wynelle Link Aug 01, 2022  1525 No significant dehydration on labs, ketones in the urine. Will be given d5 bolus. HCG and [place] is pending. Repeat BP improved making pre-eclampsia unlikely, no signs of HELLP on labs. [VK]  1640 Live IUP on [place] without obvious explanation for pain. [VK]  1643 Patient's nausea resolved, still having headache. Will trial fiorcet.  [VK]  1846 Upon reassessment, the patient's headache is improved.  She is stable for discharge home with outpatient OB follow-up and was given strict return precautions. [VK]    Clinical Course User Index [VK] Rexford Maus, DO                             Medical Decision Making This patient presents to the ED with chief complaint(s) of headache, vomiting with pertinent past medical history of [redacted] weeks pregnant by LMP which further complicates the presenting complaint. The complaint involves an extensive differential diagnosis and also carries with it a high risk of complications and morbidity.    The differential diagnosis includes migraine headache, tension headache, preeclampsia less likely as patient is only [redacted] weeks gestation, considering hyperemesis gravidarum, dehydration, electrolyte abnormality, ectopic pregnancy, molar pregnancy, UTI, no fevers or meningismus making meningitis unlikely, no trauma or falls and no focal neurologic deficits making ICH or mass effect unlikely  Additional  history obtained: Additional history obtained from spouse Records reviewed Primary Care Documents  ED Course and Reassessment: On patient's arrival to the emergency department she was mildly hypertensive but otherwise hemodynamically stable in no acute distress.  She will have repeat blood pressure performed.  The patient will be given fluids, Zofran and Tylenol for symptomatic management and will have labs and transvaginal ultrasound performed to evaluate for cause of her symptoms and will be closely reassessed.  Independent labs interpretation:  The following labs were independently interpreted: ketonuria otherwise labs within normal range  Independent visualization of imaging: - I independently visualized the following imaging with scope of interpretation limited to determining acute life threatening conditions related to emergency care: Pelvic US, which revealed Live IUP  Consultation: - Consulted or discussed management/test interpretation w/ external professional: N/A  Consideration for admission or further workup: Patient has no emergent conditions requiring admission or further work-up at this time and is stable for discharge home with primary care follow-up  Social Determinants of health: N/A    Amount and/or Complexity of Data Reviewed Labs: ordered. Radiology: ordered.  Risk OTC drugs. Prescription drug management.          Final Clinical Impression(s) / ED Diagnoses Final diagnoses:  Periodic headache syndrome, not intractable  Nausea and vomiting during pregnancy prior to [redacted] weeks gestation    Rx / DC Orders ED Discharge Orders          Ordered    Doxylamine-Pyridoxine 10-10 MG TBEC  Daily at bedtime        08/01/22 1848              Rexford Maus, DO 08/01/22 1850

## 2022-08-01 NOTE — Discharge Instructions (Addendum)
You were seen in the emergency department for your headache and your nausea.  Your workup showed no signs of severe dehydration and your baby appeared to be within your uterus with a normal heart rate.  I have given you a prescription for a nausea medication that is safe to take in pregnancy.  You should take this daily to help prevent worsening nausea.  You can continue to take Tylenol as needed for your headaches and you can follow-up with your OB/GYN to have your symptoms rechecked and see if you need to be started on any different nausea or migraine medications during this pregnancy.  You should return to the emergency department if you have acutely worsening headaches, numbness or weakness on one side of the body compared to the other, repetitive vomiting or if you have any other new or concerning symptoms.

## 2022-09-29 IMAGING — CT CT ABD-PELV W/ CM
2 of 4 series · 16 of 46 positions shown, 18 images · IV contrast (Omnipaque)
Comparison: None Available.

CLINICAL DATA: RLQ abdominal pain (Age >= 14y) right upper and
lower abd pain

EXAM:
CT ABDOMEN AND PELVIS WITH CONTRAST
TECHNIQUE: Multidetector CT imaging of the abdomen and pelvis was performed
using the standard protocol following bolus administration of
intravenous contrast.

[Series 2: axial st · axial · 0.88mm/px · z∈[-476,-36]mm · 13 of 98 slices shown, 15 images]
[im 5/98  soft-tissue]
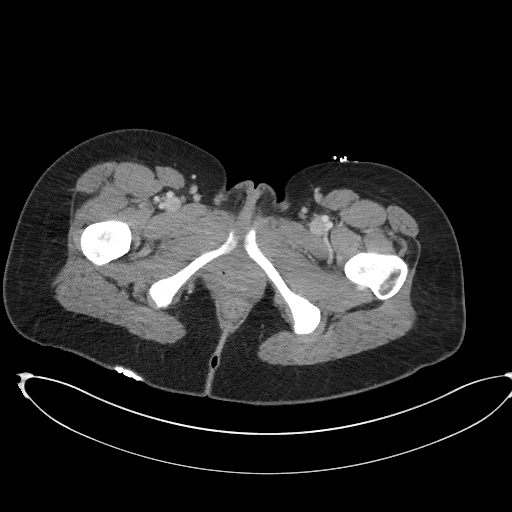
[im 5/98  bone]
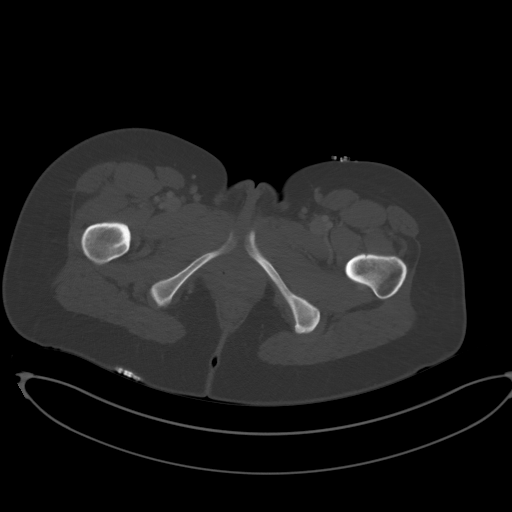
[im 13/98  soft-tissue]
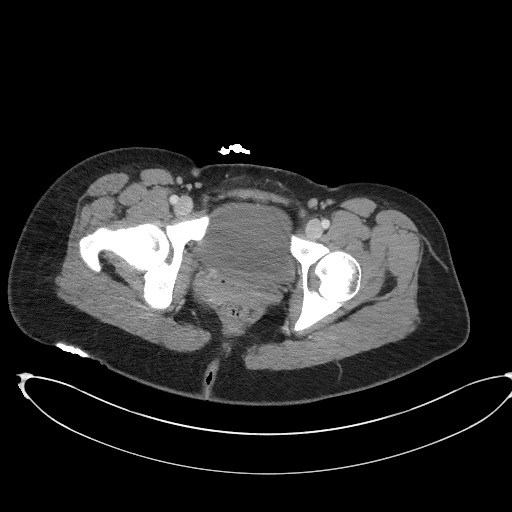
[im 22/98  soft-tissue]
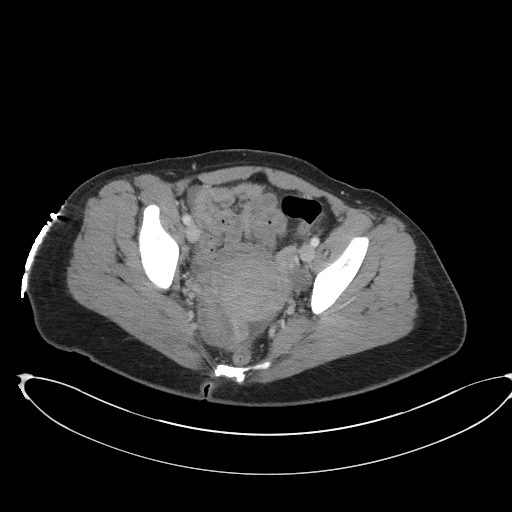
[im 26/98  soft-tissue]
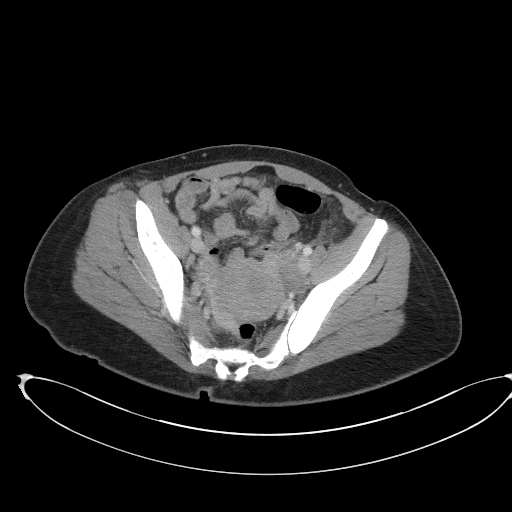
[im 34/98  soft-tissue]
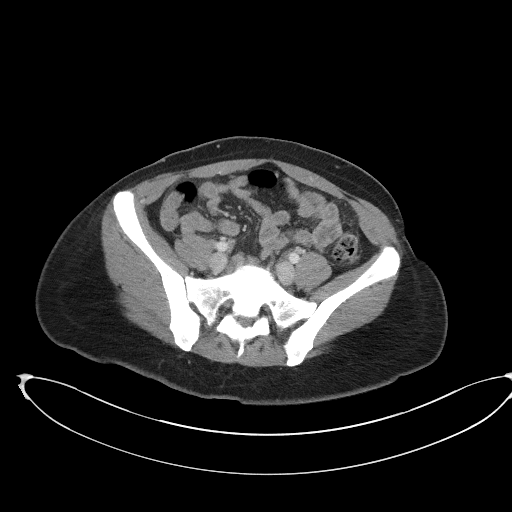
[im 43/98  soft-tissue]
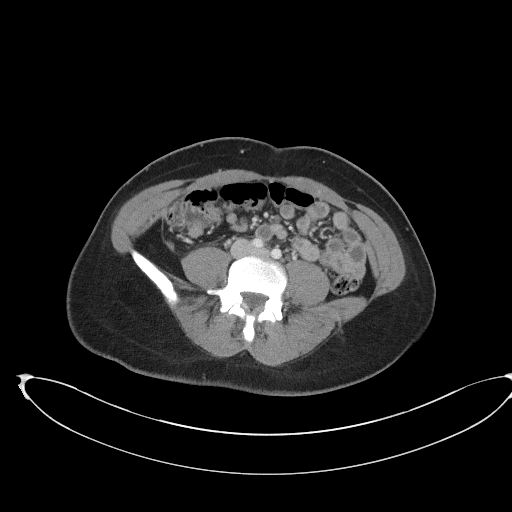
[im 51/98  soft-tissue]
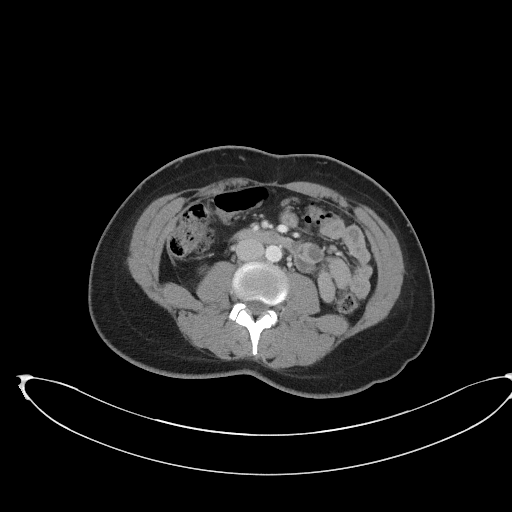
[im 55/98  soft-tissue]
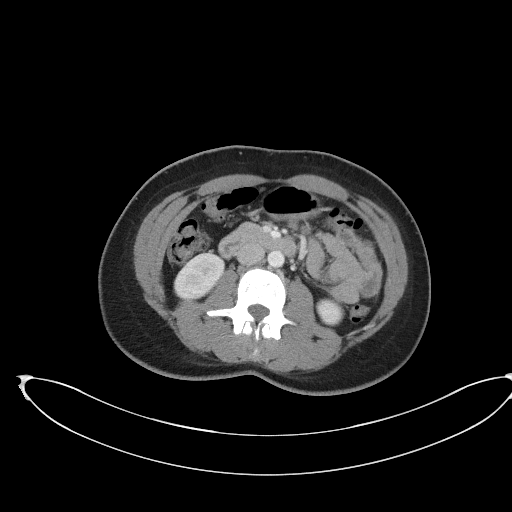
[im 64/98  soft-tissue]
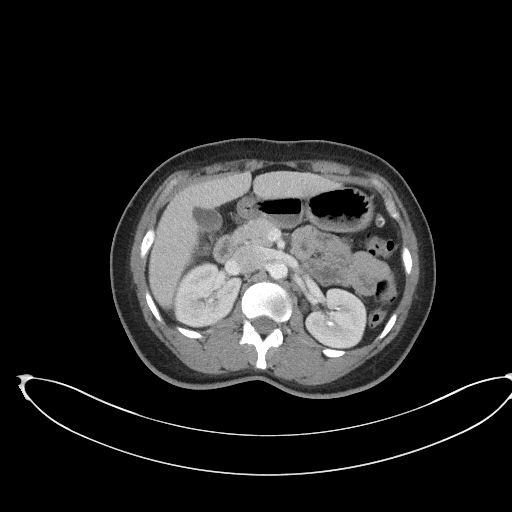
[im 64/98  bone]
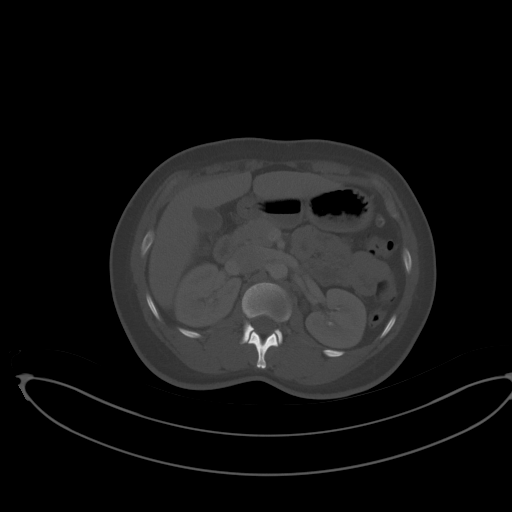
[im 72/98  soft-tissue]
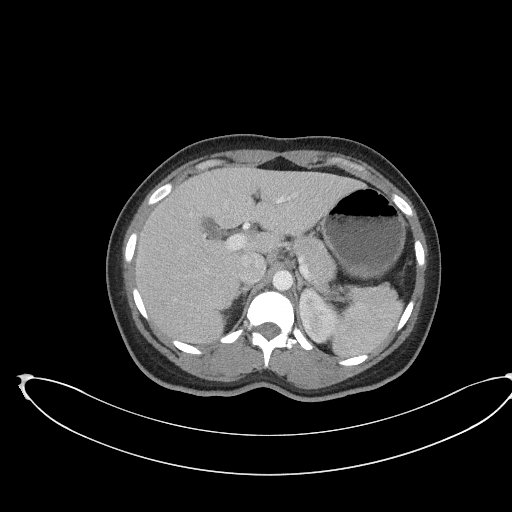
[im 76/98  soft-tissue]
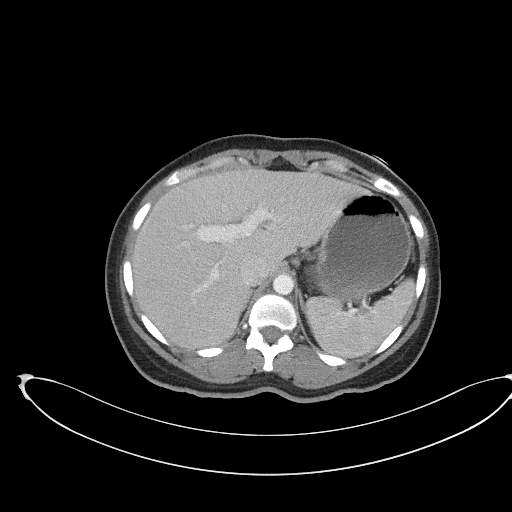
[im 85/98  soft-tissue]
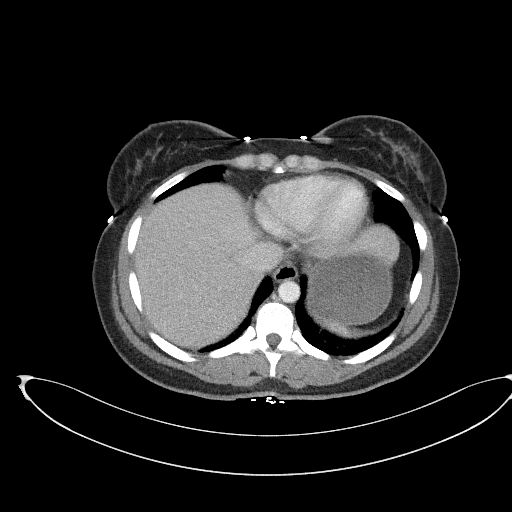
[im 93/98  soft-tissue]
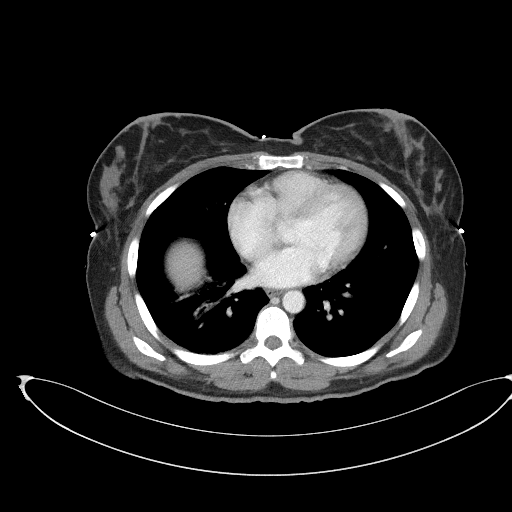

[Series 5: coronal st · coronal · 0.69mm/px · 3 of 98 slices shown]
[im 33/98  soft-tissue]
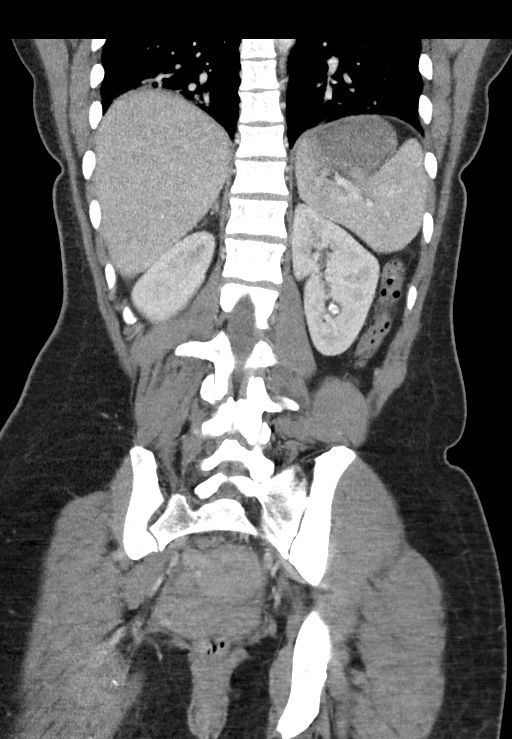
[im 44/98  soft-tissue]
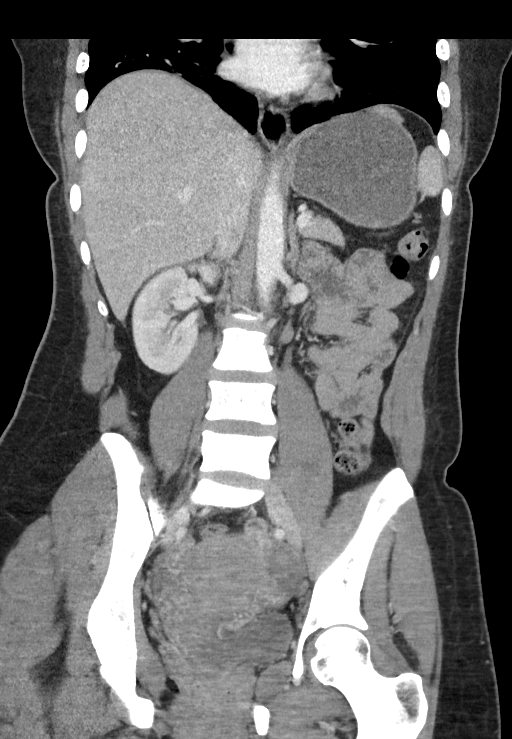
[im 54/98  soft-tissue]
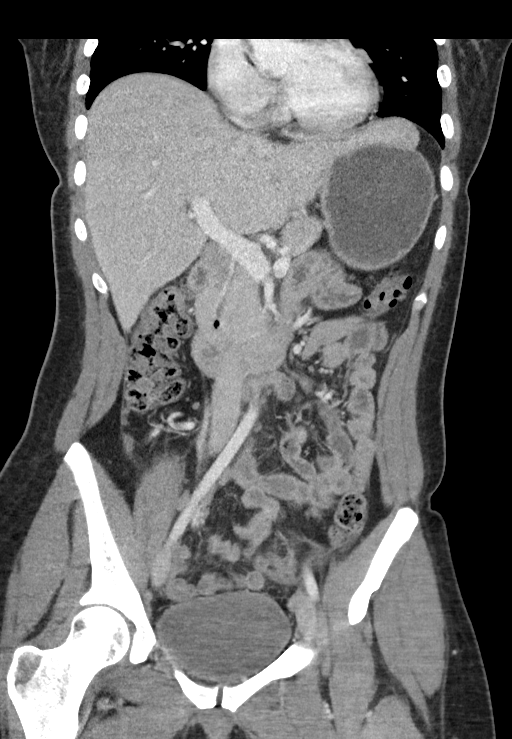

[16 of 46 positions shown; findings below may reference images not displayed]

RADIATION DOSE REDUCTION: This exam was performed according to the
departmental dose-optimization program which includes automated
exposure control, adjustment of the mA and/or kV according to
patient size and/or use of iterative reconstruction technique.

CONTRAST:  100mL OMNIPAQUE IOHEXOL 300 MG/ML  SOLN
FINDINGS: Lower chest: No acute abnormality

Hepatobiliary: No focal hepatic abnormality. Gallbladder
unremarkable.

Pancreas: No focal abnormality or ductal dilatation.

Spleen: No focal abnormality.  Normal size.

Adrenals/Urinary Tract: No adrenal abnormality. No focal renal
abnormality. Unable to assess for stones with contrast in the
collecting systems bilaterally. No hydronephrosis. Urinary bladder
is unremarkable.

Stomach/Bowel: Normal appendix. Stomach, large and small bowel
grossly unremarkable.

Vascular/Lymphatic: No evidence of aneurysm or adenopathy.

Reproductive: Uterus and adnexa unremarkable.  No mass.

Other: No free fluid or free air.

Musculoskeletal: No acute bony abnormality.
IMPRESSION: Normal appendix.

No acute findings in the abdomen or pelvis.

## 2023-01-27 ENCOUNTER — Inpatient Hospital Stay (HOSPITAL_COMMUNITY)
Admission: AD | Admit: 2023-01-27 | Discharge: 2023-01-27 | Disposition: A | Discharge status: Home / Self Care | Payer: Medicaid Other | Attending: Family Medicine | Admitting: Family Medicine

## 2023-01-27 ENCOUNTER — Encounter (HOSPITAL_COMMUNITY): Payer: Self-pay | Admitting: Family Medicine

## 2023-01-27 ENCOUNTER — Other Ambulatory Visit: Payer: Self-pay

## 2023-01-27 DIAGNOSIS — O4703 False labor before 37 completed weeks of gestation, third trimester: Secondary | ICD-10-CM | POA: Insufficient documentation

## 2023-01-27 DIAGNOSIS — Z3A34 34 weeks gestation of pregnancy: Secondary | ICD-10-CM | POA: Diagnosis not present

## 2023-01-27 DIAGNOSIS — O23593 Infection of other part of genital tract in pregnancy, third trimester: Secondary | ICD-10-CM | POA: Insufficient documentation

## 2023-01-27 DIAGNOSIS — B9689 Other specified bacterial agents as the cause of diseases classified elsewhere: Secondary | ICD-10-CM | POA: Diagnosis not present

## 2023-01-27 DIAGNOSIS — O479 False labor, unspecified: Secondary | ICD-10-CM

## 2023-01-27 LAB — URINALYSIS, ROUTINE W REFLEX MICROSCOPIC
Bilirubin Urine: NEGATIVE
Glucose, UA: NEGATIVE mg/dL
Hgb urine dipstick: NEGATIVE
Ketones, ur: 20 mg/dL — AB
Leukocytes,Ua: NEGATIVE
Nitrite: NEGATIVE
Protein, ur: NEGATIVE mg/dL
Specific Gravity, Urine: 1.006 (ref 1.005–1.030)
pH: 7 (ref 5.0–8.0)

## 2023-01-27 NOTE — MAU Note (Signed)
.  Allison Barnett is a 33 y.o. at [redacted]w[redacted]d here in MAU reporting: ctx since 0000  - every 5-6 minutes. Denies VB or LOF. +FM   Onset of complaint: 0000 Pain score: 5 Vitals:   01/27/23 0253  BP: 97/69  Pulse: 80  Resp: 19  Temp: 98.2 F (36.8 C)  SpO2: 100%     FHT:158 Lab orders placed from triage: UA

## 2023-01-27 NOTE — Discharge Instructions (Signed)
 Early Labor Tips   Rest when able, especially if your labor starts at night. Side lying positions can feel better than lying on your back  Move around if you aren't able to rest or if your labor starts at night.  Gentle movement, upright or forward-leaning positions can help your baby to be in a good position and put more pressure on your cervix so it can open. Hip circles standing or on a birth ball can help.  Drink water and eat easily digestible snacks. You may also consider an electrolyte drink (like Gatorade) if you are nauseated or do not have an appetite. Distract yourself with a movie, podcasts, walking outside, music, etc.   The Allison Barnett Circuit is a series of exercises you can do in early labor to encourage your baby into a better position to make progress in labor.  It is available with pictures at [email or web address]  Pain management at home  Try any relaxation and breathing techniques you have learned in antenatal classes. Have a massage. Your birth partner could help by rubbing your back. Take acetaminophen (Tylenol) according to the instructions on the packet - it's safe to take in labour. Have a warm bath or shower.

## 2023-01-27 NOTE — MAU Provider Note (Signed)
 Event Date/Time   First Provider Initiated Contact with Patient 01/27/23 0401      S Ms. Allison Barnett is a 33 y.o. [phone] pregnant female at [redacted]w[redacted]d who presents to MAU today with complaint of contractions. She reports the contractions started at midnight every 5-6 minutes, but are less frequent and intense now. Denies LOF, VB. Endorses regular and vigorous fetal movement  Receives care at [facility] . Prenatal records reviewed.  Pertinent items noted in HPI and remainder of comprehensive ROS otherwise negative.   O BP 103/72   Pulse 74   Temp 98.2 F (36.8 C) (Oral)   Resp 19   Ht 5' 6 (1.676 m)   Wt 83.3 kg   SpO2 99%   BMI 29.63 kg/m  Physical Exam Vitals reviewed.  Constitutional:      Appearance: Normal appearance.  HENT:     Head: Normocephalic.  Cardiovascular:     Rate and Rhythm: Normal rate.     Pulses: Normal pulses.  Pulmonary:     Effort: Pulmonary effort is normal.  Skin:    General: Skin is warm and dry.     Capillary Refill: Capillary refill takes less than 2 seconds.  Neurological:     Mental Status: She is alert and oriented to person, place, and time.  Psychiatric:        Mood and Affect: Mood normal.        Behavior: Behavior normal.        Thought Content: Thought content normal.        Judgment: Judgment normal.   Dilation: 1 Effacement (%): 40 Cervical Position: Posterior Station: Ballotable Exam by:: S Warren-Hill CNM  Results for orders placed or performed during the hospital encounter of 01/27/23 (from the past 24 hours)  Urinalysis, Routine w reflex microscopic -Urine, Clean Catch     Status: Abnormal   Collection Time: 01/27/23  2:58 AM  Result Value Ref Range   Color, Urine YELLOW YELLOW   APPearance CLEAR CLEAR   Specific Gravity, Urine 1.006 1.005 - 1.030   pH 7.0 5.0 - 8.0   Glucose, UA NEGATIVE NEGATIVE mg/dL   Hgb urine dipstick NEGATIVE NEGATIVE   Bilirubin Urine NEGATIVE NEGATIVE   Ketones, ur 20 (A) NEGATIVE mg/dL    Protein, ur NEGATIVE NEGATIVE mg/dL   Nitrite NEGATIVE NEGATIVE   Leukocytes,Ua NEGATIVE NEGATIVE    MDM: Cervical exam x2 FHT tracing Reviewed outside records  MAU Course:  A False labor with no cervical change in greater than 1 hour.  Recent diagnosis with bacterial vaginosis and has not completed treatment. Medical screening exam complete  P Findings discussed with patient. Latent labor comfort measures counseled.  Advised to complete BV treatment.  Discharge from MAU in stable condition with routine precautions Follow up at [facility] as scheduled for ongoing prenatal care  No future appointments. Allergies as of 01/27/2023       Reactions   Latex Dermatitis, Rash        Medication List     TAKE these medications    cephALEXin  500 MG capsule Commonly known as: KEFLEX  Take 1 capsule (500 mg total) by mouth 3 (three) times daily.   Doxylamine -Pyridoxine  10-10 MG Tbec Take 1 tablet by mouth at bedtime.   metroNIDAZOLE  500 MG tablet Commonly known as: FLAGYL  Take 1 tablet (500 mg total) by mouth 2 (two) times daily.   ondansetron  4 MG tablet Commonly known as: ZOFRAN  Take 1 tablet (4 mg total) by mouth every  8 (eight) hours as needed for nausea or vomiting.   oxyCODONE  5 MG immediate release tablet Commonly known as: Roxicodone  Take 1 tablet (5 mg total) by mouth every 4 (four) hours as needed for severe pain.   prenatal multivitamin Tabs tablet Take 1 tablet by mouth daily at 12 noon.        [name] LABOR, CNM 01/27/2023 5:12 AM
